# Patient Record
Sex: Male | Born: 1935 | Race: White | Hispanic: No | Smoking: Former smoker
Health system: Southern US, Community
[De-identification: ages and names within clinical notes are randomized; demographics above are authoritative.]

## PROBLEM LIST (undated history)

## (undated) DIAGNOSIS — R269 Unspecified abnormalities of gait and mobility: Secondary | ICD-10-CM

## (undated) DIAGNOSIS — I5030 Unspecified diastolic (congestive) heart failure: Secondary | ICD-10-CM

## (undated) DIAGNOSIS — I509 Heart failure, unspecified: Secondary | ICD-10-CM

## (undated) DIAGNOSIS — I2510 Atherosclerotic heart disease of native coronary artery without angina pectoris: Secondary | ICD-10-CM

## (undated) DIAGNOSIS — R0602 Shortness of breath: Secondary | ICD-10-CM

## (undated) DIAGNOSIS — C801 Malignant (primary) neoplasm, unspecified: Secondary | ICD-10-CM

## (undated) DIAGNOSIS — S4291XA Fracture of right shoulder girdle, part unspecified, initial encounter for closed fracture: Secondary | ICD-10-CM

## (undated) DIAGNOSIS — J189 Pneumonia, unspecified organism: Secondary | ICD-10-CM

## (undated) DIAGNOSIS — F0390 Unspecified dementia without behavioral disturbance: Secondary | ICD-10-CM

## (undated) DIAGNOSIS — M810 Age-related osteoporosis without current pathological fracture: Secondary | ICD-10-CM

## (undated) DIAGNOSIS — E119 Type 2 diabetes mellitus without complications: Secondary | ICD-10-CM

## (undated) DIAGNOSIS — I10 Essential (primary) hypertension: Secondary | ICD-10-CM

## (undated) HISTORY — PX: OTHER SURGICAL HISTORY: SHX169

## (undated) HISTORY — DX: Essential (primary) hypertension: I10

## (undated) HISTORY — PX: VASECTOMY: SHX75

## (undated) HISTORY — DX: Age-related osteoporosis without current pathological fracture: M81.0

## (undated) HISTORY — DX: Atherosclerotic heart disease of native coronary artery without angina pectoris: I25.10

## (undated) HISTORY — DX: Unspecified diastolic (congestive) heart failure: I50.30

## (undated) HISTORY — DX: Unspecified abnormalities of gait and mobility: R26.9

## (undated) HISTORY — DX: Fracture of right shoulder girdle, part unspecified, initial encounter for closed fracture: S42.91XA

---

## 2010-12-11 ENCOUNTER — Emergency Department (HOSPITAL_COMMUNITY): Payer: Medicare Other

## 2010-12-11 ENCOUNTER — Inpatient Hospital Stay (HOSPITAL_COMMUNITY)
Admission: EM | Admit: 2010-12-11 | Discharge: 2010-12-14 | DRG: 871 | Disposition: A | Discharge status: Home Health | Payer: Medicare Other | Attending: Internal Medicine | Admitting: Internal Medicine

## 2010-12-11 DIAGNOSIS — J189 Pneumonia, unspecified organism: Secondary | ICD-10-CM | POA: Diagnosis present

## 2010-12-11 DIAGNOSIS — I959 Hypotension, unspecified: Secondary | ICD-10-CM | POA: Diagnosis present

## 2010-12-11 DIAGNOSIS — A419 Sepsis, unspecified organism: Principal | ICD-10-CM | POA: Diagnosis present

## 2010-12-11 DIAGNOSIS — F329 Major depressive disorder, single episode, unspecified: Secondary | ICD-10-CM | POA: Diagnosis present

## 2010-12-11 DIAGNOSIS — F3289 Other specified depressive episodes: Secondary | ICD-10-CM | POA: Diagnosis present

## 2010-12-11 DIAGNOSIS — IMO0001 Reserved for inherently not codable concepts without codable children: Secondary | ICD-10-CM | POA: Diagnosis present

## 2010-12-11 DIAGNOSIS — F0390 Unspecified dementia without behavioral disturbance: Secondary | ICD-10-CM | POA: Diagnosis present

## 2010-12-11 DIAGNOSIS — E875 Hyperkalemia: Secondary | ICD-10-CM | POA: Diagnosis present

## 2010-12-11 DIAGNOSIS — F411 Generalized anxiety disorder: Secondary | ICD-10-CM | POA: Diagnosis present

## 2010-12-11 LAB — COMPREHENSIVE METABOLIC PANEL
Albumin: 3.3 g/dL — ABNORMAL LOW (ref 3.5–5.2)
BUN: 16 mg/dL (ref 6–23)
Calcium: 8.9 mg/dL (ref 8.4–10.5)
Creatinine, Ser: 1.39 mg/dL — ABNORMAL HIGH (ref 0.50–1.35)
Potassium: 4.5 mEq/L (ref 3.5–5.1)
Total Protein: 6.6 g/dL (ref 6.0–8.3)

## 2010-12-11 LAB — URINE MICROSCOPIC-ADD ON

## 2010-12-11 LAB — URINALYSIS, ROUTINE W REFLEX MICROSCOPIC
Bilirubin Urine: NEGATIVE
Nitrite: NEGATIVE
Specific Gravity, Urine: 1.024 (ref 1.005–1.030)
Urobilinogen, UA: 1 mg/dL (ref 0.0–1.0)

## 2010-12-11 LAB — GLUCOSE, CAPILLARY
Glucose-Capillary: 159 mg/dL — ABNORMAL HIGH (ref 70–99)
Glucose-Capillary: 158 mg/dL — ABNORMAL HIGH (ref 70–99)
Glucose-Capillary: 194 mg/dL — ABNORMAL HIGH (ref 70–99)
Glucose-Capillary: 238 mg/dL — ABNORMAL HIGH (ref 70–99)

## 2010-12-11 LAB — BLOOD GAS, VENOUS
Bicarbonate: 28.6 mEq/L — ABNORMAL HIGH (ref 20.0–24.0)
O2 Saturation: 74 %
Patient temperature: 98.6

## 2010-12-11 LAB — CBC
MCH: 28 pg (ref 26.0–34.0)
MCHC: 31.9 g/dL (ref 30.0–36.0)
MCV: 87.7 fL (ref 78.0–100.0)
Platelets: 183 10*3/uL (ref 150–400)
RDW: 13.4 % (ref 11.5–15.5)
WBC: 13.2 10*3/uL — ABNORMAL HIGH (ref 4.0–10.5)

## 2010-12-11 LAB — DIFFERENTIAL
Eosinophils Absolute: 0 10*3/uL (ref 0.0–0.7)
Eosinophils Relative: 0 % (ref 0–5)
Lymphs Abs: 0.3 10*3/uL — ABNORMAL LOW (ref 0.7–4.0)

## 2010-12-11 LAB — PROCALCITONIN: Procalcitonin: 0.81 ng/mL

## 2010-12-12 LAB — URINE CULTURE
Colony Count: NO GROWTH
Culture  Setup Time: 201210211120

## 2010-12-12 LAB — DIFFERENTIAL
Basophils Relative: 0 % (ref 0–1)
Eosinophils Absolute: 0.2 10*3/uL (ref 0.0–0.7)
Lymphocytes Relative: 16 % (ref 12–46)
Monocytes Absolute: 0.9 10*3/uL (ref 0.1–1.0)
Monocytes Relative: 12 % (ref 3–12)
Neutro Abs: 5.5 10*3/uL (ref 1.7–7.7)
Neutrophils Relative %: 70 % (ref 43–77)

## 2010-12-12 LAB — BASIC METABOLIC PANEL
Calcium: 8.7 mg/dL (ref 8.4–10.5)
Chloride: 103 mEq/L (ref 96–112)
Creatinine, Ser: 1.45 mg/dL — ABNORMAL HIGH (ref 0.50–1.35)
GFR calc Af Amer: 51 mL/min — ABNORMAL LOW (ref >90)

## 2010-12-12 LAB — GLUCOSE, CAPILLARY
Glucose-Capillary: 167 mg/dL — ABNORMAL HIGH (ref 70–99)
Glucose-Capillary: 285 mg/dL — ABNORMAL HIGH (ref 70–99)
Glucose-Capillary: 297 mg/dL — ABNORMAL HIGH (ref 70–99)

## 2010-12-12 LAB — CBC
MCH: 27.9 pg (ref 26.0–34.0)
MCV: 91.7 fL (ref 78.0–100.0)
Platelets: 159 10*3/uL (ref 150–400)
RDW: 13.6 % (ref 11.5–15.5)
WBC: 7.8 10*3/uL (ref 4.0–10.5)

## 2010-12-13 LAB — LIPID PANEL
Cholesterol: 185 mg/dL (ref 0–200)
Triglycerides: 100 mg/dL (ref <150)
VLDL: 20 mg/dL (ref 0–40)

## 2010-12-13 LAB — GLUCOSE, CAPILLARY
Glucose-Capillary: 326 mg/dL — ABNORMAL HIGH (ref 70–99)
Glucose-Capillary: 406 mg/dL — ABNORMAL HIGH (ref 70–99)

## 2010-12-13 LAB — CBC
MCH: 27.8 pg (ref 26.0–34.0)
Platelets: 191 10*3/uL (ref 150–400)

## 2010-12-13 LAB — BASIC METABOLIC PANEL
BUN: 17 mg/dL (ref 6–23)
CO2: 32 mEq/L (ref 19–32)
CO2: 28 mEq/L (ref 19–32)
Calcium: 9.8 mg/dL (ref 8.4–10.5)
Calcium: 9.7 mg/dL (ref 8.4–10.5)
Creatinine, Ser: 1.21 mg/dL (ref 0.50–1.35)
Glucose, Bld: 421 mg/dL — ABNORMAL HIGH (ref 70–99)
Sodium: 134 mEq/L — ABNORMAL LOW (ref 135–145)

## 2010-12-13 LAB — HEMOGLOBIN A1C
Hgb A1c MFr Bld: 7.8 % — ABNORMAL HIGH (ref <5.7)
Mean Plasma Glucose: 177 mg/dL — ABNORMAL HIGH (ref <117)

## 2010-12-14 LAB — BASIC METABOLIC PANEL
BUN: 18 mg/dL (ref 6–23)
CO2: 32 mEq/L (ref 19–32)
Chloride: 97 mEq/L (ref 96–112)
Creatinine, Ser: 1.27 mg/dL (ref 0.50–1.35)
Glucose, Bld: 65 mg/dL — ABNORMAL LOW (ref 70–99)

## 2010-12-14 LAB — GLUCOSE, CAPILLARY: Glucose-Capillary: 128 mg/dL — ABNORMAL HIGH (ref 70–99)

## 2010-12-14 NOTE — Discharge Summary (Addendum)
Brett Harris, Brett Harris               ACCOUNT NO.:  [id number]  MEDICAL RECORD NO.:  [id number]  LOCATION:  1407                         FACILITY:  [facility]  PHYSICIAN:  Kela Millin, M.D.DATE OF BIRTH:  [birth year]  DATE OF ADMISSION:  12/11/2010 DATE OF DISCHARGE:  12/14/2010                        DISCHARGE SUMMARY - REFERRING   [facility] C[facility] PROVID[facility]  [facility]  DISCHARGE DIAGNOSES: 1. Sepsis, likely secondary to atypical pneumonia. 2. Atypical pneumonia. 3. Hyperkalemia secondary to uncontrolled diabetes. 4. Diabetes type 2, uncontrolled. 5. Hypotension, resolved. 6. Dementia. 7. Depression/anxiety.  DISCHARGE MEDICATIONS: 1. Avelox 400 mg p.o. daily, take for 5 more days. 2. Prednisone 20 mg p.o. on October 25th and October 26th, then 10 mg     p.o. on October 27th and October 28th, then stop. 3. Aspirin enteric-coated 81 mg p.o. b.i.d. 4. Calcium carbonate/vitamin D 600 mg/400 units 1 tablet p.o. b.i.d. 5. Cholecalciferol/vitamin D3 of 1000 units 2 tablets p.o. daily. 6. Glipizide 10 mg p.o. b.i.d. 7. Novolin N insulin 15. 8. Omeprazole 20 mg p.o. daily. 9. Paroxetine 20 mg p.o. daily at bedtime. 10.Senokot 1 tablet p.o. every Monday, Wednesday, and Friday. 11.Seroquel 25 mg 2 tablets p.o. daily at bedtime. 12.Simvastatin 20 mg p.o. daily at bedtime. 13.Ambien 5 mg p.o. p.r.n. at bedtime for sleep.  DIAGNOSTIC LABS:  WBC is 13.2, hemoglobin 12.7, hematocrit 39.8, neutrophils 87%, absolute neutrophils 11.4.  Lactic acid 3.9.  Sodium 137, potassium 4.5, chloride 98, CO2 of 30, BUN 16, creatinine 1.39, glucose 336.  Procalcitonin 0.81.  MRSA PCR screening is negative. Urinalysis showed trace ketones, greater than 1000 glucose with 0 to 2 wbc's.  Venous blood gas yields a pH of 7.33, pCO2 of 54.9, pO2 of 42.2, bicarbonate 28.6, total CO2 of 26.0.  Blood culture is no growth to date.  Urine culture is no growth.  Hemoglobin A1c is 7.8.   Lipid profile yields LDL 116, otherwise unremarkable.  TSH is 1.22.  The proBNP is 944.8.  Random cortisol level is 16.1.  DIAGNOSTIC IMAGING:  Chest x-ray on October 21st yields cardiomegaly with interstitial prominence favoring interstitial edema over atypical pneumonia.  Atherosclerosis.  CONSULTATIONS:  None.  PROCEDURES:  None.  BRIEF HISTORY:  Brett Harris is an 77 year old male who presented to the [facility] Emergency Department on October 21st with a chief complaint of fever and chills intermittently for 2 to 3 days prior to presentation.  He also reported increased weakness and the daughter with whom he resides reports confusion and poor p.o. intake of food and liquids.  EMS was called by the family.  When they arrived, his blood pressure was found to be 67/44.  The patient was administered IV fluids en route to the emergency department; and when he arrived, his pressure was 105/49.  He was also tachycardic with a heart rate of 115.  In the emergency department, the patient had chest x-ray as dictated above.  He was also found to have an elevated white count with 87% neutrophils and lactic acid level.  [facility] were called to admit for further evaluation and treatment.  HOSPITAL COURSE BY PROBLEM: 1. Sepsis, likely secondary to atypical pneumonia.  In the emergency     department, antibiotic therapy was initiated to cover sepsis and     pneumonia with vancomycin and Zosyn.  He continued that for 5 days.     He quickly responded to fluid resuscitation and IV therapy.  He was     admitted to telemetry unit.  His heart rate became normal as well     as his respiratory rate.  At the time of this dictation, his blood     pressure has been stable.  His vital signs are stable.  His white     count was 10.3 on October 23rd down from 13.2 on admission.  He     will go home on p.o. Avelox for 5 more days. 2. Atypical pneumonia.  Chest x-ray was taken as indicated above.      Vancomycin and Zosyn IV during his hospitalization.  Will go home     on Avelox.  He will also have available oxygen support at home on     an as needed basis with exertion.  During his hospitalization, his     O2 sats have ranged between 93 to 95 on 2 L.  He does drop with     exertion on room air, so he will be going home with home O2. 3. Hyperkalemia, likely secondary to uncontrolled diabetes.  On     admission, the patient's potassium was 4.5.  On October 23rd, his     potassium was 5.8.  He was given insulin and his diabetic regimen     was changed.  At the time of this dictation, his potassium is 4.4. 4. Diabetes type 2, uncontrolled, complicated by prednisone started on     October 22nd for his wheezing and atypical pneumonia.  The patient     was covered with sliding scale insulin during his hospitalization.     Initially his glipizide was held, but as he improved and his     appetite returned, glipizide was resumed and he continued on     sliding scale insulin.  He will be tapered off his prednisone and     anticipate that his blood sugars will return to his normal     baseline.  Of note, his hemoglobin A1c was 7.8 on admission. 5. Hypertension.  On admission, the patient was mildly hypotensive.     He was fluid resuscitated.  His blood pressure range was 91/59 to     130/85.  At the time of this dictation, his blood pressure is     135/72. 6. Dementia.  At the time of this dictation, the patient is at his     baseline.  He lives with his daughter and will be going home with     her.  She indicates that his current mental status is indeed his     baseline. 7. Depression/anxiety.  Remained at baseline.  The patient was     continued on his home medications.  PHYSICAL EXAMINATION:  VITAL SIGNS:  Temperature 97.8, blood pressure 135/72, heart rate 66, respirations 16, sats 97% on 2 L. GENERAL:  Sitting up in chair, eating, smiling, no acute distress. CV:  Regular rate and  rhythm.  No murmur, gallop, or rub.  No lower extremity edema. RESPIRATORY:  Normal effort.  Breath sounds are clear to auscultation bilaterally.  No rhonchi, wheezes, or rales. ABDOMEN:  Obese, soft.  Positive bowel sounds throughout.  Nontender to palpitation.  No mass or organomegaly noted. NEUROLOGIC:  Alert and oriented to self only, cooperative, follows commands.  Can make his needs known.  ACTIVITY:  As tolerated.  DIET:  Carb modified, heart healthy.  FOLLOWUP:  The patient is to call his primary care provider, Dr. Oval Linsey, for followup appointment in 7 to 10 days.  DISPOSITION:  The patient is medically stable and ready for discharge.  TIME SPENT ON THIS DISCHARGE:  35 minutes.     Gwenyth Bender, NP   ______________________________ Kela Millin, M.D.    KMB/MEDQ  D:  12/14/2010  T:  12/14/2010  Job:  [id number]  Electronically Signed by Toya Smothers  on 12/14/2010 03:52:33 PM Electronically Signed by Donnalee Curry M.D. on 12/18/2010 03:33:35 PM

## 2010-12-17 LAB — CULTURE, BLOOD (ROUTINE X 2): Culture  Setup Time: 201210211119

## 2010-12-18 NOTE — H&P (Signed)
NAMEPATRIC, Brett Harris NO.:  [id number]  MEDICAL RECORD NO.:  [id number]  LOCATION:  1229                         FACILITY:  [facility]  PHYSICIAN:  Della Goo, M.D. DATE OF BIRTH:  Apr 02, 1927  DATE OF ADMISSION:  12/11/2010 DATE OF DISCHARGE:                             HISTORY & PHYSICAL   DATE OF ADMISSION:  12/11/2010  CHIEF COMPLAINT:  Fever.  HISTORY OF PRESENT ILLNESS:  This is an 77 year old male who was brought to the emergency department emergently secondary to fever and chills which he has had off and on for the past 2-3 days.  The patient has had increased weakness and confusion and has had poor intake of foods and liquids.  EMS was called to his home by his family and when they arrived, they checked his blood pressure and found it to be 67/44.  The patient was administered IV fluids on the way and when he arrived at the emergency department, his blood pressure was 105/49.  He was also tachycardic with a heart rate of 115.  The patient was evaluated in the emergency department and a chest x-ray revealed an atypical pneumonia.  The patient also was found to have an elevated white blood cell count of 13.2 with 87% neutrophils and lactic acid level was also performed and found to be elevated at 3.9.  The patient was referred for medical admission and placed on antibiotic therapy to cover sepsis and pneumonia with vancomycin and Zosyn.  He was referred to [facility] for [facility].  PAST MEDICAL HISTORY:  Significant for: 1. Dementia. 2. Type 2 diabetes mellitus. 3. Hyperlipidemia.  MEDICATIONS:  Simvastatin, aspirin, calcium, donepezil, Paxil, glipizide, and Novolin N.  ALLERGIES:  CYCLOSPORIN.  SOCIAL HISTORY:  The patient is a nonsmoker, nondrinker.  No history of illicit drug usage.  FAMILY HISTORY:  Unable to obtain.  REVIEW OF SYSTEMS:  Unable to obtain other than what is mentioned above in the HPI.  PHYSICAL  EXAMINATION:  GENERAL:  This is an 77 year old, morbidly obese, elderly Caucasian male who was in initial acute distress, now stabilizing. VITAL SIGNS:  His maximum temperature was 102.1 rectally, blood pressure initially 67/44 with a heart rate of 115, respirations 12; now blood pressure is 92/44, the heart rate is 90, respirations 25, and O2 saturations 93%. HEENT:  Normocephalic, atraumatic.  Pupils equally round, reactive to light.  Extraocular movements are intact.  Funduscopic benign.  There is no scleral icterus.  Nares are patent bilaterally.  Oropharynx is clear. NECK:  Supple.  Full range of motion.  No thyromegaly, adenopathy, jugular venous distention. CARDIOVASCULAR:  Tachycardic rate, rhythm.  No murmurs, gallops, rubs appreciated. LUNGS:  Decreased breath sounds.  No rales, rhonchi, wheezes appreciated. ABDOMEN:  Positive bowel sounds, soft, nontender, nondistended.  No hepatosplenomegaly. EXTREMITIES:  Without cyanosis, clubbing, or edema. NEUROLOGIC:  The patient is confused, but is not clear what his baseline is.  His speech is clear and he is able to move all 4 of his extremities.  LABORATORY STUDIES:  White blood cell count 13.2, hemoglobin 12.7, hematocrit 39.8, platelets 183, neutrophils 87%, lymphocytes 3%.  Sodium 137, potassium 4.5, chloride 98, carbon dioxide 30,  BUN 16, creatinine 1.39, and glucose 336.  Venous blood gas with a pH of 7.336, pCO2 of 54.9, pO2 of 42.2, bicarbonate 28.6, and O2 saturation 74%.  Lactic acid level 3.9.  Procalcitonin level 0.81.  Chest x-ray as mentioned above, reveals findings of atypical pneumonia.  ASSESSMENT:  An 77 year old male being admitted with, 1. Sepsis. 2. Atypical pneumonia. 3. Hypotension. 4. Hyperglycemia. 5. Dementia with acute delirium.  PLAN:  The patient will be admitted to the step-down ICU area.  He has been placed on antibiotic therapy.  Blood cultures have been performed. The antibiotic therapy is  vancomycin and Zosyn at this time.  Nebulizer treatments have also been ordered and supplemental oxygen has been ordered.  The patient has been placed on IV fluids for fluid resuscitation, and sliding scale insulin coverage has also been ordered for elevated blood sugars.  His regular medications will be further evaluated and reconciled.  The patient will be placed on DVT prophylaxis.  Further workup will ensue pending results patient's clinical course.  He is a full code at this time.     Della Goo, M.D.     HJ/MEDQ  D:  12/11/2010  T:  12/11/2010  Job:  [id number]  Electronically Signed by Della Goo M.D. on 12/18/2010 07:17:18 PM

## 2010-12-23 LAB — GLUCOSE, CAPILLARY: Glucose-Capillary: 392 mg/dL — ABNORMAL HIGH (ref 70–99)

## 2012-04-10 ENCOUNTER — Emergency Department (HOSPITAL_COMMUNITY): Payer: Medicare Other

## 2012-04-10 ENCOUNTER — Inpatient Hospital Stay (HOSPITAL_COMMUNITY)
Admission: EM | Admit: 2012-04-10 | Discharge: 2012-04-15 | DRG: 292 | Disposition: A | Discharge status: Skilled Nursing Facility | Payer: Medicare Other | Attending: Internal Medicine | Admitting: Internal Medicine

## 2012-04-10 ENCOUNTER — Encounter (HOSPITAL_COMMUNITY): Payer: Self-pay | Admitting: Emergency Medicine

## 2012-04-10 DIAGNOSIS — R609 Edema, unspecified: Secondary | ICD-10-CM

## 2012-04-10 DIAGNOSIS — E119 Type 2 diabetes mellitus without complications: Secondary | ICD-10-CM | POA: Diagnosis present

## 2012-04-10 DIAGNOSIS — I5031 Acute diastolic (congestive) heart failure: Principal | ICD-10-CM | POA: Diagnosis present

## 2012-04-10 DIAGNOSIS — I509 Heart failure, unspecified: Secondary | ICD-10-CM | POA: Diagnosis present

## 2012-04-10 DIAGNOSIS — F03918 Unspecified dementia, unspecified severity, with other behavioral disturbance: Secondary | ICD-10-CM | POA: Diagnosis present

## 2012-04-10 DIAGNOSIS — H9190 Unspecified hearing loss, unspecified ear: Secondary | ICD-10-CM | POA: Diagnosis present

## 2012-04-10 DIAGNOSIS — N179 Acute kidney failure, unspecified: Secondary | ICD-10-CM | POA: Diagnosis present

## 2012-04-10 DIAGNOSIS — Z66 Do not resuscitate: Secondary | ICD-10-CM | POA: Diagnosis present

## 2012-04-10 DIAGNOSIS — R0602 Shortness of breath: Secondary | ICD-10-CM

## 2012-04-10 HISTORY — DX: Unspecified dementia, unspecified severity, without behavioral disturbance, psychotic disturbance, mood disturbance, and anxiety: F03.90

## 2012-04-10 HISTORY — DX: Heart failure, unspecified: I50.9

## 2012-04-10 HISTORY — DX: Shortness of breath: R06.02

## 2012-04-10 HISTORY — DX: Malignant (primary) neoplasm, unspecified: C80.1

## 2012-04-10 HISTORY — DX: Pneumonia, unspecified organism: J18.9

## 2012-04-10 HISTORY — DX: Type 2 diabetes mellitus without complications: E11.9

## 2012-04-10 LAB — CBC WITH DIFFERENTIAL/PLATELET
Basophils Relative: 0 % (ref 0–1)
Eosinophils Absolute: 0.2 10*3/uL (ref 0.0–0.7)
MCH: 27.5 pg (ref 26.0–34.0)
MCHC: 30.7 g/dL (ref 30.0–36.0)
Neutrophils Relative %: 62 % (ref 43–77)
Platelets: 213 10*3/uL (ref 150–400)

## 2012-04-10 LAB — CBC
MCH: 27.2 pg (ref 26.0–34.0)
MCHC: 30.4 g/dL (ref 30.0–36.0)
MCV: 89.4 fL (ref 78.0–100.0)
Platelets: 217 10*3/uL (ref 150–400)
RBC: 4.82 MIL/uL (ref 4.22–5.81)
RDW: 14.7 % (ref 11.5–15.5)

## 2012-04-10 LAB — PRO B NATRIURETIC PEPTIDE: Pro B Natriuretic peptide (BNP): 1591 pg/mL — ABNORMAL HIGH (ref 0–450)

## 2012-04-10 LAB — COMPREHENSIVE METABOLIC PANEL
ALT: 13 U/L (ref 0–53)
Albumin: 3.4 g/dL — ABNORMAL LOW (ref 3.5–5.2)
Alkaline Phosphatase: 107 U/L (ref 39–117)
BUN: 17 mg/dL (ref 6–23)
Potassium: 4.9 mEq/L (ref 3.5–5.1)
Sodium: 138 mEq/L (ref 135–145)
Total Protein: 7.1 g/dL (ref 6.0–8.3)

## 2012-04-10 LAB — GLUCOSE, CAPILLARY: Glucose-Capillary: 265 mg/dL — ABNORMAL HIGH (ref 70–99)

## 2012-04-10 LAB — URINALYSIS, ROUTINE W REFLEX MICROSCOPIC
Glucose, UA: NEGATIVE mg/dL
Ketones, ur: NEGATIVE mg/dL
Leukocytes, UA: NEGATIVE
Specific Gravity, Urine: 1.009 (ref 1.005–1.030)
pH: 5 (ref 5.0–8.0)

## 2012-04-10 MED ORDER — INSULIN ASPART 100 UNIT/ML ~~LOC~~ SOLN
0.0000 [IU] | Freq: Three times a day (TID) | SUBCUTANEOUS | Status: DC
  Administered 2012-04-11: 8 [IU] via SUBCUTANEOUS
  Administered 2012-04-11 – 2012-04-13 (×5): 2 [IU] via SUBCUTANEOUS
  Administered 2012-04-13 – 2012-04-14 (×2): 3 [IU] via SUBCUTANEOUS
  Administered 2012-04-14 (×2): 2 [IU] via SUBCUTANEOUS
  Administered 2012-04-15: 8 [IU] via SUBCUTANEOUS

## 2012-04-10 MED ORDER — QUETIAPINE FUMARATE 50 MG PO TABS
50.0000 mg | ORAL_TABLET | Freq: Two times a day (BID) | ORAL | Status: DC
  Administered 2012-04-10 – 2012-04-14 (×7): 50 mg via ORAL
  Filled 2012-04-10 (×11): qty 1

## 2012-04-10 MED ORDER — SODIUM CHLORIDE 0.9 % IJ SOLN
3.0000 mL | Freq: Two times a day (BID) | INTRAMUSCULAR | Status: DC
  Administered 2012-04-10 – 2012-04-15 (×9): 3 mL via INTRAVENOUS

## 2012-04-10 MED ORDER — CALCIUM CARBONATE-VITAMIN D 500-200 MG-UNIT PO TABS
1.0000 | ORAL_TABLET | Freq: Two times a day (BID) | ORAL | Status: DC
  Administered 2012-04-10 – 2012-04-15 (×10): 1 via ORAL
  Filled 2012-04-10 (×12): qty 1

## 2012-04-10 MED ORDER — INSULIN ASPART 100 UNIT/ML ~~LOC~~ SOLN
0.0000 [IU] | Freq: Every day | SUBCUTANEOUS | Status: DC
  Administered 2012-04-10 – 2012-04-13 (×2): 3 [IU] via SUBCUTANEOUS

## 2012-04-10 MED ORDER — SERTRALINE HCL 50 MG PO TABS
50.0000 mg | ORAL_TABLET | Freq: Every morning | ORAL | Status: DC
  Administered 2012-04-11 – 2012-04-15 (×5): 50 mg via ORAL
  Filled 2012-04-10 (×5): qty 1

## 2012-04-10 MED ORDER — ATORVASTATIN CALCIUM 40 MG PO TABS
40.0000 mg | ORAL_TABLET | Freq: Every day | ORAL | Status: DC
  Administered 2012-04-10 – 2012-04-14 (×5): 40 mg via ORAL
  Filled 2012-04-10 (×7): qty 1

## 2012-04-10 MED ORDER — ASPIRIN EC 81 MG PO TBEC
81.0000 mg | DELAYED_RELEASE_TABLET | Freq: Two times a day (BID) | ORAL | Status: DC
  Administered 2012-04-10 – 2012-04-15 (×10): 81 mg via ORAL
  Filled 2012-04-10 (×11): qty 1

## 2012-04-10 MED ORDER — INSULIN GLARGINE 100 UNIT/ML ~~LOC~~ SOLN
30.0000 [IU] | Freq: Every day | SUBCUTANEOUS | Status: DC
  Administered 2012-04-10 – 2012-04-14 (×5): 30 [IU] via SUBCUTANEOUS

## 2012-04-10 MED ORDER — FUROSEMIDE 10 MG/ML IJ SOLN
60.0000 mg | Freq: Once | INTRAMUSCULAR | Status: AC
  Administered 2012-04-10: 60 mg via INTRAVENOUS
  Filled 2012-04-10: qty 8

## 2012-04-10 MED ORDER — FUROSEMIDE 10 MG/ML IJ SOLN
20.0000 mg | Freq: Every day | INTRAMUSCULAR | Status: DC
  Administered 2012-04-11 – 2012-04-12 (×2): 20 mg via INTRAVENOUS
  Filled 2012-04-10 (×2): qty 2

## 2012-04-10 MED ORDER — SODIUM CHLORIDE 0.9 % IJ SOLN: 3.0000 mL | INTRAMUSCULAR | Status: DC | PRN

## 2012-04-10 MED ORDER — ACETAMINOPHEN 325 MG PO TABS
650.0000 mg | ORAL_TABLET | ORAL | Status: DC | PRN
  Administered 2012-04-14: 650 mg via ORAL
  Filled 2012-04-10: qty 2

## 2012-04-10 MED ORDER — HEPARIN SODIUM (PORCINE) 5000 UNIT/ML IJ SOLN
5000.0000 [IU] | Freq: Three times a day (TID) | INTRAMUSCULAR | Status: DC
  Administered 2012-04-10 – 2012-04-15 (×15): 5000 [IU] via SUBCUTANEOUS
  Filled 2012-04-10 (×17): qty 1

## 2012-04-10 MED ORDER — QUETIAPINE 12.5 MG HALF TABLET
12.5000 mg | ORAL_TABLET | Freq: Two times a day (BID) | ORAL | Status: DC | PRN
  Administered 2012-04-11 – 2012-04-12 (×3): 12.5 mg via ORAL
  Filled 2012-04-10 (×4): qty 1

## 2012-04-10 MED ORDER — ONDANSETRON HCL 4 MG/2ML IJ SOLN: 4.0000 mg | Freq: Four times a day (QID) | INTRAMUSCULAR | Status: DC | PRN

## 2012-04-10 MED ORDER — SODIUM CHLORIDE 0.9 % IV SOLN: 250.0000 mL | INTRAVENOUS | Status: DC | PRN

## 2012-04-10 MED ORDER — SODIUM CHLORIDE 0.9 % IV SOLN
INTRAVENOUS | Status: DC
  Administered 2012-04-10 (×2): via INTRAVENOUS

## 2012-04-10 NOTE — ED Provider Notes (Signed)
History     CSN: [id number]  Arrival date & time 04/10/12  1345   First MD Initiated Contact with Patient 04/10/12 1511      Chief Complaint  Patient presents with  . Weakness    (Consider location/radiation/quality/duration/timing/severity/associated sxs/prior treatment) Patient is a 79 y.o. male presenting with weakness. The history is provided by the patient and a relative.  Weakness   patient here with increased weakness x1 week that became worse today. He has had associated cough has been productive but without fever. No recent falls, headache, vomiting, diarrhea. Does have a history of Alzheimer's and his daughter noticed that he is had some anorexia. No rashes appreciated. Went to urgent care prior to arrival and had a UA that was normal but had an abnormal EKG and was sent her for further evaluation. He denies any anginal type chest pain  Past Medical History  Diagnosis Date  . Dementia     No past surgical history on file.  No family history on file.  History  Substance Use Topics  . Smoking status: Not on file  . Smokeless tobacco: Not on file  . Alcohol Use: Not on file      Review of Systems  Neurological: Positive for weakness.  All other systems reviewed and are negative.    Allergies  Review of patient's allergies indicates not on file.  Home Medications  No current outpatient prescriptions on file.  BP 87/68  Pulse 77  Temp(Src) 97.8 F (36.6 C) (Oral)  Resp 18  SpO2 99%  Physical Exam  Nursing note and vitals reviewed. Constitutional: He is oriented to person, place, and time. He appears well-developed and well-nourished.  Non-toxic appearance. No distress.  HENT:  Head: Normocephalic and atraumatic.  Eyes: Conjunctivae, EOM and lids are normal. Pupils are equal, round, and reactive to light.  Neck: Normal range of motion. Neck supple. No tracheal deviation present. No mass present.  Cardiovascular: Normal rate, regular rhythm and  normal heart sounds.  Exam reveals no gallop.   No murmur heard. Pulmonary/Chest: Effort normal. No stridor. No respiratory distress. He has decreased breath sounds. He has no wheezes. He has rhonchi. He has no rales.  Abdominal: Soft. Normal appearance and bowel sounds are normal. He exhibits no distension. There is no tenderness. There is no rebound and no CVA tenderness.  Musculoskeletal: Normal range of motion. He exhibits no edema and no tenderness.  Neurological: He is alert and oriented to person, place, and time. He has normal strength. No cranial nerve deficit or sensory deficit. GCS eye subscore is 4. GCS verbal subscore is 5. GCS motor subscore is 6.  Skin: Skin is warm and dry. No abrasion and no rash noted.  Psychiatric: He has a normal mood and affect. His speech is normal and behavior is normal.    ED Course  Procedures (including critical care time)  Labs Reviewed  GLUCOSE, CAPILLARY - Abnormal; Notable for the following:    Glucose-Capillary 116 (*)    All other components within normal limits  URINE CULTURE  CBC WITH DIFFERENTIAL  COMPREHENSIVE METABOLIC PANEL  URINALYSIS, ROUTINE W REFLEX MICROSCOPIC  PRO B NATRIURETIC PEPTIDE   No results found.   No diagnosis found.    MDM   Date: 04/10/2012  Rate: 84  Rhythm: normal sinus rhythm  QRS Axis: normal  Intervals: normal  ST/T Wave abnormalities: normal  Conduction Disutrbances:none  Narrative Interpretation:   Old EKG Reviewed: none available  5:32 PM Patient given Lasix  for his early CHF will be admitted by the hospital        Toy Baker, MD 04/10/12 [id number]

## 2012-04-10 NOTE — ED Notes (Signed)
Patient transported to X-ray 

## 2012-04-10 NOTE — Progress Notes (Signed)
[facility] ED CM noted Cm consult for home health services & [facility] services CM spoke with pt, wife about choice of services and pt is active with [facility] and the choice is to remain active with [facility]. Pt & wife expressed that they preferred to be at [facility] versus [facility] facility at this time.  Wife has contacted [facility] to inform them that pt is in hospital.  She inquired if she should contact the [place]. ED CM informed her she can call the [facility] if she prefers.    HOME HEALTH AGENCIES SERVING [place]   Agencies that are Medicare-Certified and are affiliated with The [facility] Health Agency  Telephone Number Address  [facility]   The [facility] has ownership interest in this company; however, you are under no obligation to use this agency. [phone] or  [phone] [address] [place], [place] [address] [email or web address]   Agencies that are Medicare-Certified and are not affiliated with The [facility] Agency Telephone Number Address  [facility] [phone] Fax [phone] [address], [address] [place], [place]  [address] [email or web address]  [facility] [phone] or [phone] Fax [phone] [address] [place], [place] [address] [email or web address]  [facility] [phone] Fax [phone] [address] [place], [place] [address] [email or web address]  [facility] [phone] Fax [phone] [address] [place], [place]  [address] [email or web address]  [facility] [phone] Fax [phone] [address] [place], [place] [address] [email or web address]  [facility] [phone] [address] [place], [place]  [address] NationalDirectors.dk  [facility] [phone]  [address] [place], [place] [address] [email or web address]  [facility] [phone] or [phone] Fax number [phone] [address] [place], [place]  [address] [email or web address]  [facility] [phone] Fax [phone] [address] [place], [place]  [address]  [facility]  [phone] Fax [phone] [address] [place], [place] [address] [email or web address]

## 2012-04-10 NOTE — ED Notes (Signed)
Daughter sent pt to urgent care and ems was called due to daughter said pt is not eating for 1 week and weak. Pt denies this and is asking for food at this time. Pt has dementia and denies any of these compliants. Alert to person and location. bm this am, urinate without diffuculties.

## 2012-04-10 NOTE — Progress Notes (Signed)
Correction: male at pt's bedside is the daughter states pcp is at [facility] [address] [facility] [place]?

## 2012-04-10 NOTE — ED Notes (Addendum)
Pt's daughter sts pt has not been feeling well or eating well for "atleast five days."  Denies pain, except " from sitting in this bed."  Daughter sts peripheral edema x 1 week.  Pt c/o difficulty taking a deep breaths.

## 2012-04-10 NOTE — ED Notes (Signed)
ZOX:[id number]<AV> Expected date:04/10/12<BR> Expected time: 1:29 PM<BR> Means of arrival:Ambulance<BR> Comments:<BR> aloc

## 2012-04-10 NOTE — H&P (Signed)
[facility] History and Physical  Kazden Moeser QMV:[id number] DOB: [birth year] DOA: 04/10/2012  Referring physician: er PCP: No primary provider on file.  Specialists: none  Chief Complaint: SOB  HPI: Brett Harris is a 79 y.o. male  Who is demented and can not provide much history.  He is also very hard of hearing.  Daughter relays that has had increasing SOB and weakness over the last few weeks.  No fever.  + cough.  Patient does not eat or drink much per daughter.  No chest pain.  Daughter relays that patient has had LE edema and the inability to lay flat while sleeping.  He ambulates well with a cane at home but has a tendency to wander.  In the ER, he was found to have fluid on chest x ray and an elevated BNP.  He was given 60 IV lasis and hospitalist were called for admission.   Review of Systems: unable to do full ROS due to dementia  Past Medical History  Diagnosis Date  . Dementia    No past surgical history on file. Social History:  has no tobacco, alcohol, and drug history on file. Lives at home with daughter who cares for him 24/7- walks with a cane  Allergies  Allergen Reactions  . Cephalosporins     Family Hx; CAD  Prior to Admission medications   Medication Sig Start Date End Date Taking? Authorizing Provider  aspirin EC 81 MG tablet Take 81 mg by mouth 2 (two) times daily.   Yes Historical Provider, MD  atorvastatin (LIPITOR) 40 MG tablet Take 40 mg by mouth every evening.   Yes Historical Provider, MD  calcium-vitamin D (OSCAL WITH D) 500-200 MG-UNIT per tablet Take 1 tablet by mouth 2 (two) times daily.   Yes Historical Provider, MD  insulin glargine (LANTUS) 100 UNIT/ML injection Inject 30 Units into the skin at bedtime.   Yes Historical Provider, MD  insulin regular (NOVOLIN R,HUMULIN R) 100 units/mL injection Inject 6-18 Units into the skin 3 (three) times daily before meals. Takes 18 units at breakfast 16 units at lunch and 6 units at dinner   Yes  Historical Provider, MD  QUEtiapine (SEROQUEL) 25 MG tablet Take 12.5 mg by mouth 2 (two) times daily as needed (mood).   Yes Historical Provider, MD  QUEtiapine (SEROQUEL) 50 MG tablet Take 50 mg by mouth 2 (two) times daily.   Yes Historical Provider, MD  senna (SENOKOT) 8.6 MG tablet Take 2 tablets by mouth every Monday, Wednesday, and Friday.   Yes Historical Provider, MD  sertraline (ZOLOFT) 50 MG tablet Take 50 mg by mouth every morning.   Yes Historical Provider, MD   Physical Exam: Filed Vitals:   04/10/12 1434 04/10/12 1559  BP: 87/68 119/62  Pulse: 77 86  Temp: 97.8 F (36.6 C) 97.9 F (36.6 C)  TempSrc: Oral   Resp: 18 20  SpO2: 99% 97%     General:  Pleasant/cooperative, hard of hearing  Eyes: wnl  ENT: wnl  Neck: no JVD but unable to lay flat due to back pain  Cardiovascular: rrr  Respiratory: few crackles on back lung- no wheezing  Abdomen: +BS, soft, NT.ND  Skin: few areas of bruising  Musculoskeletal: moves all 4 extremities  Psychiatric: no SI/no HI  Neurologic: CN 2-12 intact  Labs on Admission:  Basic Metabolic Panel:  Recent Labs Lab 04/10/12 1611  NA 138  K 4.9  CL 96  CO2 33*  GLUCOSE 79  BUN 17  CREATININE 1.34  CALCIUM 9.0   Liver Function Tests:  Recent Labs Lab 04/10/12 1611  AST 46*  ALT 13  ALKPHOS 107  BILITOT 0.4  PROT 7.1  ALBUMIN 3.4*   No results found for this basename: LIPASE, AMYLASE,  in the last 168 hours No results found for this basename: AMMONIA,  in the last 168 hours CBC:  Recent Labs Lab 04/10/12 1611  WBC 8.7  NEUTROABS 5.4  HGB 12.8*  HCT 41.7  MCV 89.7  PLT 213   Cardiac Enzymes: No results found for this basename: CKTOTAL, CKMB, CKMBINDEX, TROPONINI,  in the last 168 hours  BNP (last 3 results)  Recent Labs  04/10/12 1611  PROBNP 1591.0*   CBG:  Recent Labs Lab 04/10/12 1401  GLUCAP 116*    Radiological Exams on Admission: Dg Chest 2 View  04/10/2012  *RADIOLOGY  REPORT*  Clinical Data: Short of breath, chest pain for 2 days, dementia  CHEST - 2 VIEW  Comparison: Portable chest x-ray of 12/11/2010  Findings: Moderate cardiomegaly is stable, and there is mild pulmonary vascular congestion present.  Small effusions cannot be excluded.  No skeletal abnormality is seen.  Fixation device is noted within the right humeral head and neck.  IMPRESSION: Cardiomegaly with mild pulmonary vascular congestion.   Original Report Authenticated By: Dwyane Dee, M.D.       Assessment/Plan Active Problems:   SOB (shortness of breath)   Edema   Diabetes   1. SOB- check Echo, chest x ray consistant with pulm edema.  Cycle CE, lasix, daily weights, strict I/Os, only requiring 1L O2, increased BNP so I suspect some form of heart failure 2. CKD- stable 3. Dementia- at baseline 4. LE edema- stable, monitor response to diuretic 5. DM- SSI, check HgbA1C     Code Status: DNR- discussed with daughter, MPOA Family Communication: daughter at bedside Disposition Plan: 2-3 days- daughter plans to take back home after D/C  Time spent: 73 min  Benjamine Mola [facility] Pager [phone]  If 7PM-7AM, please contact night-coverage [email or web address] Password [facility] 04/10/2012, 5:44 PM

## 2012-04-10 NOTE — Progress Notes (Signed)
Pt called Cm to assist him with his call bell instructions and pt with concern about not having fluids in CM explained to pt how to use his call bell and why it is important not to have po intake until EDP states it is ok

## 2012-04-11 DIAGNOSIS — I517 Cardiomegaly: Secondary | ICD-10-CM

## 2012-04-11 LAB — TROPONIN I
Troponin I: <0.3 ng/mL (ref <0.30)
Troponin I: <0.3 ng/mL (ref <0.30)

## 2012-04-11 LAB — BASIC METABOLIC PANEL
CO2: 34 mEq/L — ABNORMAL HIGH (ref 19–32)
GFR calc non Af Amer: 43 mL/min — ABNORMAL LOW (ref >90)
Glucose, Bld: 132 mg/dL — ABNORMAL HIGH (ref 70–99)
Potassium: 4.3 mEq/L (ref 3.5–5.1)
Sodium: 139 mEq/L (ref 135–145)

## 2012-04-11 LAB — URINE CULTURE: Colony Count: NO GROWTH

## 2012-04-11 LAB — GLUCOSE, CAPILLARY
Glucose-Capillary: 127 mg/dL — ABNORMAL HIGH (ref 70–99)
Glucose-Capillary: 284 mg/dL — ABNORMAL HIGH (ref 70–99)
Glucose-Capillary: 143 mg/dL — ABNORMAL HIGH (ref 70–99)
Glucose-Capillary: 173 mg/dL — ABNORMAL HIGH (ref 70–99)

## 2012-04-11 LAB — HEMOGLOBIN A1C
Hgb A1c MFr Bld: 7.6 % — ABNORMAL HIGH (ref <5.7)
Mean Plasma Glucose: 171 mg/dL — ABNORMAL HIGH (ref <117)

## 2012-04-11 LAB — MAGNESIUM: Magnesium: 1.9 mg/dL (ref 1.5–2.5)

## 2012-04-11 LAB — TSH: TSH: 3.056 u[IU]/mL (ref 0.350–4.500)

## 2012-04-11 LAB — CREATININE, SERUM: Creatinine, Ser: 1.55 mg/dL — ABNORMAL HIGH (ref 0.50–1.35)

## 2012-04-11 MED ORDER — LORAZEPAM 2 MG/ML IJ SOLN
0.5000 mg | Freq: Once | INTRAMUSCULAR | Status: AC
  Administered 2012-04-11: 0.5 mg via INTRAMUSCULAR
  Filled 2012-04-11: qty 1

## 2012-04-11 NOTE — Progress Notes (Signed)
Pt had 5 beats  V-tach. Pt asymptomatic and asleep. Pt having frequent PVC's. VS stable. MD notified. Mag added to labs. Will cont to monitor.  Earnest Conroy. Clelia Croft, RN

## 2012-04-11 NOTE — Evaluation (Signed)
Physical Therapy Evaluation Patient Details Name: Brett Harris MRN: [id number] DOB: 12-Nov-1927 Today's Date: 04/11/2012 Time: 4098-1191 PT Time Calculation (min): 22 min  PT Assessment / Plan / Recommendation Clinical Impression  Pt presents with SOB.  Noted history of dementia and is very HOH.  Tolerated OOB and took several steps from bed to chair with min/mod assist, however refused to ambulate and stated he "just wanted to rest."  Pt will benefit from skilled PT in acute venue to address deficits.  PT recommends SNF for follow up at D/C, however noted in chart that pts daughter wants to take pt home, therefore recommend 24/7 hands on care/supervision with HHPT.      PT Assessment  Patient needs continued PT services    Follow Up Recommendations  Home health PT;SNF;Supervision/Assistance - 24 hour    Does the patient have the potential to tolerate intense rehabilitation      Barriers to Discharge   Unsure if daughter was providing 24/7 assist.     Equipment Recommendations  Rolling walker with 5" wheels    Recommendations for Other Services OT consult   Frequency Min 3X/week    Precautions / Restrictions Precautions Precautions: Fall Precaution Comments: monitor O2 Restrictions Weight Bearing Restrictions: No   Pertinent Vitals/Pain Reports some pain in B feet.       Mobility  Bed Mobility Bed Mobility: Supine to Sit Supine to Sit: 3: Mod assist;HOB elevated Details for Bed Mobility Assistance: Requires hand held assist from therapist in order to get trunk into sitting position.  Provided cues for technique, however pt unable to fully follow commands during session.  Transfers Transfers: Sit to Stand;Stand to [employer] Transfers Sit to Stand: 4: Min assist;With upper extremity assist;From bed Stand to Sit: 4: Min assist;With upper extremity assist;With armrests;To chair/3-in-1 Stand Pivot Transfers: 4: Min assist;3: Mod assist Details for Transfer Assistance:  Assist to rise, steady and ensure controlled descent with max cues for hand placement, however pt refused to push from bed and would only pull up on RW. Pt able to take several steps from bed to chair with min/mod assist, however refused to ambulate any further and stated he "wanted to rest."  Ambulation/Gait Ambulation/Gait Assistance Details: Again, pt refused to ambulate and stated he "just wanted to rest."   Stairs: No Wheelchair Mobility Wheelchair Mobility: No    Exercises     PT Diagnosis: Difficulty walking;Generalized weakness;Acute pain  PT Problem List: Decreased strength;Decreased activity tolerance;Decreased balance;Decreased mobility;Decreased coordination;Decreased cognition;Decreased knowledge of use of DME;Decreased safety awareness;Decreased knowledge of precautions;Pain PT Treatment Interventions: DME instruction;Gait training;Functional mobility training;Therapeutic activities;Therapeutic exercise;Stair training;Balance training;Patient/family education   PT Goals Acute Rehab PT Goals PT Goal Formulation: Patient unable to participate in goal setting Time For Goal Achievement: 04/25/12 Potential to Achieve Goals: Fair Pt will go Supine/Side to Sit: with supervision PT Goal: Supine/Side to Sit - Progress: Goal set today Pt will go Sit to Supine/Side: with supervision PT Goal: Sit to Supine/Side - Progress: Goal set today Pt will go Sit to Stand: with supervision PT Goal: Sit to Stand - Progress: Goal set today Pt will go Stand to Sit: with supervision PT Goal: Stand to Sit - Progress: Goal set today Pt will Ambulate: 51 - 150 feet;with supervision;with least restrictive assistive device PT Goal: Ambulate - Progress: Goal set today Pt will Go Up / Down Stairs:  (to set if pt D/Cs home w/ stairs. )  Visit Information  Last PT Received On: 04/11/12 Assistance Needed: +1  Subjective Data  Subjective: I just want to rest for a while.  Patient Stated Goal: n/a    Prior Functioning  Home Living Lives With: Daughter Available Help at Discharge: Family Type of Home: House Additional Comments: Per notes, pt lives with daughter.  Pt poor historian and unable to fully obtain PLOF or home environment info.   Prior Function Comments: Pt states he was using a walker and cane sometimes.  Again pt poor historian and unable to fully obtain full PLOF.     Cognition  Cognition Overall Cognitive Status: No family/caregiver present to determine baseline cognitive functioning Arousal/Alertness: Lethargic Orientation Level: Disoriented to;Place;Situation;Time Behavior During Session: [place] for tasks performed    Extremity/Trunk Assessment Right Lower Extremity Assessment RLE ROM/Strength/Tone: Deficits RLE ROM/Strength/Tone Deficits: Pt with generalized weakness, grossly 3/5 per tasks assessed.  Left Lower Extremity Assessment LLE ROM/Strength/Tone: Deficits LLE ROM/Strength/Tone Deficits: Pt with generalized weakness, grossly 3/5 per tasks assessed.  Trunk Assessment Trunk Assessment: Kyphotic   Balance    End of Session PT - End of Session Equipment Utilized During Treatment: Gait belt Activity Tolerance: Patient limited by fatigue Patient left: in chair;with call bell/phone within reach;with chair alarm set;with nursing in room Nurse Communication: Mobility status  GP     Vista Deck 04/11/2012, 11:50 AM

## 2012-04-11 NOTE — Progress Notes (Signed)
  Echocardiogram 2D Echocardiogram has been performed.  Cathie Beams 04/11/2012, 3:18 PM

## 2012-04-11 NOTE — Progress Notes (Addendum)
[facility] PROGRESS NOTE  Brett Harris NFA:[id number] DOB: 15-Feb-1928 DOA: 04/10/2012 PCP: [facility], MD  Assessment/Plan: 1. Probable acute CHF  -will continue lasix -cardiac enzymes neg -await echo to further eval 2. DM -continue lantus and SSI 3. Dementia -continue outpt meds, per daughter recently started on seroquel- and dose adjusted to 50 to control his behavioral problems, will continue. Also on zoloft. 4.ARF -Monitor closely with with diuresis  Code Status: DNR Family Communication: Daughter at bedside Disposition Plan: likely home with 24/7 assistance when medically stable   Consultants:  none  Procedures:  none  Antibiotics:  none  HPI/Subjective: Pt sleepy but easily aroused, states breathing better and per daughter less swelling.He denies CP.  Objective: Filed Vitals:   04/10/12 2306 04/11/12 0437 04/11/12 0500 04/11/12 1400  BP: 126/76 146/92  124/57  Pulse: 80 91  80  Temp:  97.5 F (36.4 C)  97.8 F (36.6 C)  TempSrc:  Oral  Oral  Resp:    20  Height:      Weight:   106.6 kg (235 lb 0.2 oz)   SpO2:  94%  96%    Intake/Output Summary (Last 24 hours) at 04/11/12 1716 Last data filed at 04/11/12 1254  Gross per 24 hour  Intake 595.67 ml  Output   2050 ml  Net -1454.33 ml   Filed Weights   04/10/12 2144 04/11/12 0500  Weight: 108 kg (238 lb 1.6 oz) 106.6 kg (235 lb 0.2 oz)    Exam:   General: Elderly male, sleepy but easily aroused in NAD  Cardiovascular: RRR, nl s1s2  Respiratory: scattered rhonchi bil, no wheezes  Abdomen: soft,+BS NT/ND  EXT: +1edema, no cyanosis  Data Reviewed: Basic Metabolic Panel:  Recent Labs Lab 04/10/12 1611 04/10/12 2316 04/11/12 0255  NA 138  --  139  K 4.9  --  4.3  CL 96  --  97  CO2 33*  --  34*  GLUCOSE 79  --  132*  BUN 17  --  20  CREATININE 1.34 1.55* 1.44*  CALCIUM 9.0  --  9.0  MG  --   --  1.9   Liver Function Tests:  Recent Labs Lab 04/10/12 1611   AST 46*  ALT 13  ALKPHOS 107  BILITOT 0.4  PROT 7.1  ALBUMIN 3.4*   No results found for this basename: LIPASE, AMYLASE,  in the last 168 hours No results found for this basename: AMMONIA,  in the last 168 hours CBC:  Recent Labs Lab 04/10/12 1611 04/10/12 2316  WBC 8.7 8.5  NEUTROABS 5.4  --   HGB 12.8* 13.1  HCT 41.7 43.1  MCV 89.7 89.4  PLT 213 217   Cardiac Enzymes:  Recent Labs Lab 04/10/12 2316 04/11/12 0235 04/11/12 0845  TROPONINI <0.30 <0.30 <0.30   BNP (last 3 results)  Recent Labs  04/10/12 1611  PROBNP 1591.0*   CBG:  Recent Labs Lab 04/10/12 1401 04/10/12 2147 04/11/12 0803 04/11/12 1149  GLUCAP 116* 265* 127* 284*    No results found for this or any previous visit (from the past 240 hour(s)).   Studies: Dg Chest 2 View  04/10/2012  *RADIOLOGY REPORT*  Clinical Data: Short of breath, chest pain for 2 days, dementia  CHEST - 2 VIEW  Comparison: Portable chest x-ray of 12/11/2010  Findings: Moderate cardiomegaly is stable, and there is mild pulmonary vascular congestion present.  Small effusions cannot be excluded.  No skeletal abnormality is seen.  Fixation device is noted within the right humeral head and neck.  IMPRESSION: Cardiomegaly with mild pulmonary vascular congestion.   Original Report Authenticated By: Dwyane Dee, M.D.     Scheduled Meds: . aspirin EC  81 mg Oral BID  . atorvastatin  40 mg Oral q1800  . calcium-vitamin D  1 tablet Oral BID WC  . furosemide  20 mg Intravenous Daily  . heparin  5,000 Units Subcutaneous Q8H  . insulin aspart  0-15 Units Subcutaneous TID WC  . insulin aspart  0-5 Units Subcutaneous QHS  . insulin glargine  30 Units Subcutaneous QHS  . QUEtiapine  50 mg Oral BID  . sertraline  50 mg Oral q morning - 10a  . sodium chloride  3 mL Intravenous Q12H   Continuous Infusions: . sodium chloride 10 mL/hr at 04/10/12 2258    Active Problems:   SOB (shortness of breath)   Edema   Diabetes    Time  spent: >35MINS    [facility]  [facility] Pager [phone]. If 8PM-8AM, please contact night-coverage at [email or web address], password [facility] 04/11/2012, 5:16 PM  LOS: 1 day

## 2012-04-11 NOTE — Progress Notes (Signed)
Pt had 17 beats of V-tach. Pt asymptomatic, VS stable. On call MD notified. No new orders received. Will continue to monitor.  Earnest Conroy. Clelia Croft, RN

## 2012-04-11 NOTE — Progress Notes (Signed)
Inpatient Diabetes Program Recommendations  AACE/ADA: New Consensus Statement on Inpatient Glycemic Control (2013)  Target Ranges:  Prepandial:   less than 140 mg/dL      Peak postprandial:   less than 180 mg/dL (1-2 hours)      Critically ill patients:  140 - 180 mg/dL   [place] for Visit: Hyperglycemia  Results for JAMILE, SIVILS (MRN [id number]) as of 04/11/2012 13:10  Ref. Range 04/10/2012 14:01 04/10/2012 21:47 04/11/2012 08:03 04/11/2012 11:49  Glucose-Capillary Latest Range: 70-99 mg/dL 409 (H) 811 (H) 914 (H) 284 (H)   Results for Clauss, Coran (MRN [id number]) as of 04/11/2012 13:10  Ref. Range 04/11/2012 02:55  Hemoglobin A1C Latest Range: <5.7 % 7.6 (H)    Inpatient Diabetes Program Recommendations Insulin - Meal Coverage: Add meal coverage insulin if pt eats >50% meal - Novolog 3 units tidwc  Note: Will continue to follow.  Thank you. Ailene Ards, RD, LDN, CDE Inpatient Diabetes Coordinator [phone]

## 2012-04-12 LAB — BASIC METABOLIC PANEL
BUN: 27 mg/dL — ABNORMAL HIGH (ref 6–23)
CO2: 35 mEq/L — ABNORMAL HIGH (ref 19–32)
Calcium: 9.4 mg/dL (ref 8.4–10.5)
Creatinine, Ser: 1.7 mg/dL — ABNORMAL HIGH (ref 0.50–1.35)

## 2012-04-12 LAB — GLUCOSE, CAPILLARY
Glucose-Capillary: 93 mg/dL (ref 70–99)
Glucose-Capillary: 90 mg/dL (ref 70–99)
Glucose-Capillary: 159 mg/dL — ABNORMAL HIGH (ref 70–99)

## 2012-04-12 MED ORDER — QUETIAPINE 12.5 MG HALF TABLET
12.5000 mg | ORAL_TABLET | ORAL | Status: DC
  Administered 2012-04-13 – 2012-04-15 (×3): 12.5 mg via ORAL
  Filled 2012-04-12 (×3): qty 1

## 2012-04-12 NOTE — Progress Notes (Signed)
Pt woke up screaming and inconsolable. Daughter at bedside. MD on-call Notified. Orders to give prn seroquel now. Will continue to monitor.  Earnest Conroy. Clelia Croft, RN

## 2012-04-12 NOTE — Progress Notes (Signed)
   CARE MANAGEMENT NOTE 04/12/2012  Patient:  Brett Harris, Brett Harris   Account Number:  [id number]  Date Initiated:  04/12/2012  Documentation initiated by:  Jiles Crocker  Subjective/Objective Assessment:   ADMITTED WITH CHF     Action/Plan:   [facility];  LIVES WITH FAMILY MEMBERS AND IS ACTIVE WITH [place] FOR HOME HEALTH CARE; POSSIBLY NEED SNF AT DISCHARGE   Anticipated DC Date:  04/17/2012   Anticipated DC Plan:  SKILLED NURSING FACILITY  In-house referral  Clinical Social Worker      DC Planning Services  CM consult          Status of service:  In process, will continue to follow Medicare Important Message given?  NA - LOS <3 / Initial given by admissions (If response is "NO", the following Medicare IM given date fields will be blank) Per UR Regulation:  Reviewed for med. necessity/level of care/duration of stay Comments:  04/13/2011- B Winston Misner RN,BSN,MHA

## 2012-04-12 NOTE — Progress Notes (Signed)
Inpatient Diabetes Program Recommendations  AACE/ADA: New Consensus Statement on Inpatient Glycemic Control (2013)  Target Ranges:  Prepandial:   less than 140 mg/dL      Peak postprandial:   less than 180 mg/dL (1-2 hours)      Critically ill patients:  140 - 180 mg/dL   [place] for Visit: Pt takes meal coverage at home, documented as 18 units  breakfast, 16 units before lunch, and Results for GERRITT, GALENTINE (MRN [id number]) as of 04/12/2012 10:44  Ref. Range 04/11/2012 08:03 04/11/2012 11:49 04/11/2012 18:24 04/11/2012 21:38  Glucose-Capillary Latest Range: 70-99 mg/dL 914 (H) 782 (H) 956 (H) 173 (H)   Inpatient Diabetes Program Recommendations Insulin - Meal Coverage: Add meal coverage insulin if pt eats >50% meal - Novolog 3 units tidwc  Note: Agree with note from Diabetes coordinator's note from yesterday.. Fasting glucose normal requiring no correction, then high in 200's before lunch, then normal before supper, then high at HS. Thank you, Lenor Coffin, RN, CNS, Diabetes Coordinator [phone])

## 2012-04-12 NOTE — Progress Notes (Signed)
Pt screaming, demanding wash cloths and demanding to get out of bed, inconsolable. Pt was in chair after pushing his way out of bed with staff in room, but had to be placed back in bed d/t frequent chair alarms and safety risk. Pt ripped out IV and became more agitated when RN attempted to re-insert IV. Pt refusing IV restart at this time. MD notified, new orders received, will continue to monitor.

## 2012-04-12 NOTE — Progress Notes (Signed)
[facility] PROGRESS NOTE  Delfino Radu WUJ:[id number] DOB: 07-01-1927 DOA: 04/10/2012 PCP: [facility], MD  Assessment/Plan: 1. Probable acute CHF  -will continue lasix -cardiac enzymes neg -echo with EF 55%  And grade 1diastolic dysfunction  -clinically improved, further bump in cr today- will hold lasix 2. DM -continue lantus and SSI 3. Dementia -continue outpt meds, per daughter recently started on seroquel- and dose adjusted to 50 and with prn doses to control his behavioral problems, will continue. I have scheduled a 1:30 dose per daughter's request as he gets at home. Also on zoloft. 4.ARF -cr continue to trend up with diuresis, will hold lasix   Code Status: DNR Family Communication: Daughter at bedside Disposition Plan: daughter wants pt to go to to SNF when ready   Consultants:  none  Procedures:  none  Antibiotics:  none  HPI/Subjective: Pt much more alert this am, states breathing better  Objective: Filed Vitals:   04/11/12 1400 04/11/12 2107 04/12/12 0500 04/12/12 0505  BP: 124/57 96/55  103/57  Pulse: 80 91  94  Temp: 97.8 F (36.6 C) 98.4 F (36.9 C)  97.6 F (36.4 C)  TempSrc: Oral Oral  Oral  Resp: 20 22  20   Height:      Weight:   104.7 kg (230 lb 13.2 oz)   SpO2: 96% 96%  92%    Intake/Output Summary (Last 24 hours) at 04/12/12 1106 Last data filed at 04/12/12 0700  Gross per 24 hour  Intake    490 ml  Output    800 ml  Net   -310 ml   Filed Weights   04/10/12 2144 04/11/12 0500 04/12/12 0500  Weight: 108 kg (238 lb 1.6 oz) 106.6 kg (235 lb 0.2 oz) 104.7 kg (230 lb 13.2 oz)    Exam:   General: Elderly male, sleepy but easily aroused in NAD  Cardiovascular: RRR, nl s1s2  Respiratory: decreased BS at bases, no crackles, no wheezes  Abdomen: soft,+BS NT/ND  EXT: trace edema, no cyanosis  Data Reviewed: Basic Metabolic Panel:  Recent Labs Lab 04/10/12 1611 04/10/12 2316 04/11/12 0255 04/12/12 0425   NA 138  --  139 135  K 4.9  --  4.3 4.5  CL 96  --  97 93*  CO2 33*  --  34* 35*  GLUCOSE 79  --  132* 141*  BUN 17  --  20 27*  CREATININE 1.34 1.55* 1.44* 1.70*  CALCIUM 9.0  --  9.0 9.4  MG  --   --  1.9  --    Liver Function Tests:  Recent Labs Lab 04/10/12 1611  AST 46*  ALT 13  ALKPHOS 107  BILITOT 0.4  PROT 7.1  ALBUMIN 3.4*   No results found for this basename: LIPASE, AMYLASE,  in the last 168 hours No results found for this basename: AMMONIA,  in the last 168 hours CBC:  Recent Labs Lab 04/10/12 1611 04/10/12 2316  WBC 8.7 8.5  NEUTROABS 5.4  --   HGB 12.8* 13.1  HCT 41.7 43.1  MCV 89.7 89.4  PLT 213 217   Cardiac Enzymes:  Recent Labs Lab 04/10/12 2316 04/11/12 0235 04/11/12 0845  TROPONINI <0.30 <0.30 <0.30   BNP (last 3 results)  Recent Labs  04/10/12 1611  PROBNP 1591.0*   CBG:  Recent Labs Lab 04/10/12 2147 04/11/12 0803 04/11/12 1149 04/11/12 1824 04/11/12 2138  GLUCAP 265* 127* 284* 143* 173*    Recent Results (from the past  240 hour(s))  URINE CULTURE     Status: None   Collection Time    04/10/12  3:20 PM      Result Value Range Status   Specimen Description URINE, CLEAN CATCH   Final   Special Requests NONE   Final   Culture  Setup Time 04/11/2012 03:09   Final   [place] Count NO GROWTH   Final   Culture NO GROWTH   Final   Report Status 04/11/2012 FINAL   Final     Studies: Dg Chest 2 View  04/10/2012  *RADIOLOGY REPORT*  Clinical Data: Short of breath, chest pain for 2 days, dementia  CHEST - 2 VIEW  Comparison: Portable chest x-ray of 12/11/2010  Findings: Moderate cardiomegaly is stable, and there is mild pulmonary vascular congestion present.  Small effusions cannot be excluded.  No skeletal abnormality is seen.  Fixation device is noted within the right humeral head and neck.  IMPRESSION: Cardiomegaly with mild pulmonary vascular congestion.   Original Report Authenticated By: Dwyane Dee, M.D.     Scheduled  Meds: . aspirin EC  81 mg Oral BID  . atorvastatin  40 mg Oral q1800  . calcium-vitamin D  1 tablet Oral BID WC  . furosemide  20 mg Intravenous Daily  . heparin  5,000 Units Subcutaneous Q8H  . insulin aspart  0-15 Units Subcutaneous TID WC  . insulin aspart  0-5 Units Subcutaneous QHS  . insulin glargine  30 Units Subcutaneous QHS  . QUEtiapine  50 mg Oral BID  . sertraline  50 mg Oral q morning - 10a  . sodium chloride  3 mL Intravenous Q12H   Continuous Infusions: . sodium chloride 10 mL/hr at 04/10/12 2258    Active Problems:   SOB (shortness of breath)   Edema   Diabetes    Time spent: >25MINS    Kela Millin  [facility] Pager [phone]. If 8PM-8AM, please contact night-coverage at [email or web address], password [facility] 04/12/2012, 11:06 AM  LOS: 2 days

## 2012-04-12 NOTE — Progress Notes (Signed)
Physical Therapy Treatment Patient Details Name: Brett Harris MRN: [id number] DOB: [birth year] Today's Date: 04/12/2012 Time: 4098-1191 PT Time Calculation (min): 17 min  PT Assessment / Plan / Recommendation Comments on Treatment Session  Pt did agree to ambulate today, however very unsteady, even with RW due to walking with it too far ahead of him.  Also noted SOB during ambulation.  See ambulation section for O2 sats.  PT continues to recommend SNF for follow up at D/C.    Follow Up Recommendations  SNF;Supervision/Assistance - 24 hour     Does the patient have the potential to tolerate intense rehabilitation     Barriers to Discharge        Equipment Recommendations  Rolling walker with 5" wheels    Recommendations for Other Services OT consult  Frequency Min 3X/week   Plan Discharge plan remains appropriate    Precautions / Restrictions Precautions Precautions: Fall Precaution Comments: monitor O2 Restrictions Weight Bearing Restrictions: No   Pertinent Vitals/Pain No stated pain    Mobility  Bed Mobility Bed Mobility: Supine to Sit Supine to Sit: 3: Mod assist;2: Max assist;HOB elevated Details for Bed Mobility Assistance: Continues to require increased assist for trunk to obtain sitting position.  Also some assist with LEs.  Max cues for technique, safety and attending to task.  Transfers Transfers: Sit to Stand;Stand to Sit;Stand Pivot Transfers Sit to Stand: 3: Mod assist;From elevated surface;With upper extremity assist;From bed Stand to Sit: 4: Min assist;With upper extremity assist;To bed Details for Transfer Assistance: Assist to rise, steady and ensure controlled descent with MAX cues for hand placement and safety.  Ambulation/Gait Ambulation/Gait Assistance: 3: Mod assist Ambulation Distance (Feet): 80 Feet Assistive device: Rolling walker Ambulation/Gait Assistance Details: Assist to steady throughout with increased assist also for negotiating RW.  MAX  cues for maintaining position inside of RW, however pt unable to fully follow cues and walks with RW too far ahead of him.  Also noted some SOB during ambulation with one standing rest break.  Ambulated on RA with SaO2 at 88% following ambulation.  Reapplied 2 L O2 once back in room.  Gait Pattern: Step-through pattern;Decreased stride length;Trunk flexed General Gait Details: Pt very unsteady and would recommend +2 for safety.  Stairs: No    Exercises     PT Diagnosis:    PT Problem List:   PT Treatment Interventions:     PT Goals Acute Rehab PT Goals PT Goal Formulation: Patient unable to participate in goal setting Time For Goal Achievement: 04/25/12 Potential to Achieve Goals: Fair Pt will go Supine/Side to Sit: with supervision PT Goal: Supine/Side to Sit - Progress: Progressing toward goal Pt will go Sit to Supine/Side: with supervision PT Goal: Sit to Supine/Side - Progress: Progressing toward goal Pt will go Sit to Stand: with supervision PT Goal: Sit to Stand - Progress: Progressing toward goal Pt will go Stand to Sit: with supervision PT Goal: Stand to Sit - Progress: Progressing toward goal Pt will Ambulate: 51 - 150 feet;with supervision;with least restrictive assistive device PT Goal: Ambulate - Progress: Progressing toward goal  Visit Information  Last PT Received On: 04/12/12 Assistance Needed: +2 (for O2 with amb, safety)    Subjective Data  Subjective: Help me up Patient Stated Goal: n/a   Cognition  Cognition Overall Cognitive Status: No family/caregiver present to determine baseline cognitive functioning Arousal/Alertness: Lethargic Orientation Level: Disoriented to;Place;Situation;Time Behavior During Session: Lethargic    Balance     End of Session PT -  End of Session Equipment Utilized During Treatment: Gait belt Activity Tolerance: Patient limited by fatigue Patient left: in bed;with call bell/phone within reach;with bed alarm set;with nursing in  room Nurse Communication: Mobility status   GP     Vista Deck 04/12/2012, 2:36 PM

## 2012-04-13 LAB — GLUCOSE, CAPILLARY
Glucose-Capillary: 146 mg/dL — ABNORMAL HIGH (ref 70–99)
Glucose-Capillary: 177 mg/dL — ABNORMAL HIGH (ref 70–99)

## 2012-04-13 LAB — BASIC METABOLIC PANEL
BUN: 25 mg/dL — ABNORMAL HIGH (ref 6–23)
Chloride: 91 mEq/L — ABNORMAL LOW (ref 96–112)
GFR calc Af Amer: 54 mL/min — ABNORMAL LOW (ref >90)
GFR calc non Af Amer: 46 mL/min — ABNORMAL LOW (ref >90)
Potassium: 4.3 mEq/L (ref 3.5–5.1)
Sodium: 135 mEq/L (ref 135–145)

## 2012-04-13 MED ORDER — FUROSEMIDE 40 MG PO TABS
40.0000 mg | ORAL_TABLET | Freq: Every day | ORAL | Status: DC
  Administered 2012-04-13: 40 mg via ORAL
  Filled 2012-04-13 (×2): qty 1

## 2012-04-13 MED ORDER — LORAZEPAM 2 MG/ML IJ SOLN
0.5000 mg | Freq: Once | INTRAMUSCULAR | Status: AC
  Administered 2012-04-13: 0.5 mg via INTRAVENOUS
  Filled 2012-04-13: qty 1

## 2012-04-13 MED ORDER — QUETIAPINE 12.5 MG HALF TABLET
12.5000 mg | ORAL_TABLET | Freq: Every day | ORAL | Status: DC | PRN
  Administered 2012-04-13: 12.5 mg via ORAL
  Filled 2012-04-13 (×2): qty 1

## 2012-04-13 NOTE — Progress Notes (Signed)
[facility] PROGRESS NOTE  Masoud Forlenza BJY:[id number] DOB: 15-May-1927 DOA: 04/10/2012 PCP: [facility], [place]  Assessment/Plan: 1. Probable acute CHF  -will continue lasix -cardiac enzymes neg -echo with EF 55%  And grade 1diastolic dysfunction  -cr improved today, will resume lasix- PO and continue close monitoring  2. DM -continue lantus and SSI 3. Dementia -continue outpt meds, per daughter recently started on seroquel- and dose adjusted to 50 and with prn doses to control his behavioral problems, will continue. continue scheduled 1:30 dose per daughter's request as he gets at home. Also on zoloft. 4.ARF -cr improved to 1.34 to today 2/22, follow  Code Status: DNR Family Communication: Daughter at bedside Disposition Plan: daughter wants pt to go to to SNF when ready   Consultants:  none  Procedures:  none  Antibiotics:  none  HPI/Subjective: Pt states he feels all right this am, no new c/o reported.  Objective: Filed Vitals:   04/12/12 2104 04/13/12 0603 04/13/12 0800 04/13/12 1334  BP: 126/59 125/60 124/56 124/53  Pulse: 80 89 74 77  Temp: 97.7 F (36.5 C) 97.7 F (36.5 C) 98.4 F (36.9 C) 98.4 F (36.9 C)  TempSrc: Oral Oral Oral Axillary  Resp: 20 20 20 20   Height:      Weight:  104.8 kg (231 lb 0.7 oz)    SpO2: 97% 98% 96% 92%    Intake/Output Summary (Last 24 hours) at 04/13/12 1410 Last data filed at 04/13/12 1000  Gross per 24 hour  Intake    240 ml  Output   1525 ml  Net  -1285 ml   Filed Weights   04/11/12 0500 04/12/12 0500 04/13/12 0603  Weight: 106.6 kg (235 lb 0.2 oz) 104.7 kg (230 lb 13.2 oz) 104.8 kg (231 lb 0.7 oz)    Exam:   General: Elderly male, sleepy but easily aroused in NAD  Cardiovascular: RRR, nl s1s2  Respiratory: decreased BS at bases, no crackles, no wheezes  Abdomen: soft,+BS NT/ND  EXT: no edema, no cyanosis  Data Reviewed: Basic Metabolic Panel:  Recent Labs Lab 04/10/12 1611  04/10/12 2316 04/11/12 0255 04/12/12 0425 04/13/12 0443  NA 138  --  139 135 135  K 4.9  --  4.3 4.5 4.3  CL 96  --  97 93* 91*  CO2 33*  --  34* 35* 36*  GLUCOSE 79  --  132* 141* 123*  BUN 17  --  20 27* 25*  CREATININE 1.34 1.55* 1.44* 1.70* 1.36*  CALCIUM 9.0  --  9.0 9.4 8.8  MG  --   --  1.9  --   --    Liver Function Tests:  Recent Labs Lab 04/10/12 1611  AST 46*  ALT 13  ALKPHOS 107  BILITOT 0.4  PROT 7.1  ALBUMIN 3.4*   No results found for this basename: LIPASE, AMYLASE,  in the last 168 hours No results found for this basename: AMMONIA,  in the last 168 hours CBC:  Recent Labs Lab 04/10/12 1611 04/10/12 2316  WBC 8.7 8.5  NEUTROABS 5.4  --   HGB 12.8* 13.1  HCT 41.7 43.1  MCV 89.7 89.4  PLT 213 217   Cardiac Enzymes:  Recent Labs Lab 04/10/12 2316 04/11/12 0235 04/11/12 0845  TROPONINI <0.30 <0.30 <0.30   BNP (last 3 results)  Recent Labs  04/10/12 1611  PROBNP 1591.0*   CBG:  Recent Labs Lab 04/12/12 1716 04/12/12 2141 04/13/12 0802 04/13/12 1128 04/13/12 1315  GLUCAP 90 159* 146* 177* 153*    Recent Results (from the past 240 hour(s))  URINE CULTURE     Status: None   Collection Time    04/10/12  3:20 PM      Result Value Range Status   Specimen Description URINE, CLEAN CATCH   Final   Special Requests NONE   Final   Culture  Setup Time 04/11/2012 03:09   Final   Colony Count NO GROWTH   Final   Culture NO GROWTH   Final   Report Status 04/11/2012 FINAL   Final     Studies: No results found.  Scheduled Meds: . aspirin EC  81 mg Oral BID  . atorvastatin  40 mg Oral q1800  . calcium-vitamin D  1 tablet Oral BID WC  . furosemide  40 mg Oral Daily  . heparin  5,000 Units Subcutaneous Q8H  . insulin aspart  0-15 Units Subcutaneous TID WC  . insulin aspart  0-5 Units Subcutaneous QHS  . insulin glargine  30 Units Subcutaneous QHS  . QUEtiapine  12.5 mg Oral Custom  . QUEtiapine  50 mg Oral BID  . sertraline  50 mg  Oral q morning - 10a  . sodium chloride  3 mL Intravenous Q12H   Continuous Infusions: . sodium chloride 10 mL/hr at 04/10/12 2258    Active Problems:   SOB (shortness of breath)   Edema   Diabetes    Time spent: >25MINS    Kela Millin  [facility] Pager [phone]. If 8PM-8AM, please contact night-coverage at [email or web address], password [facility] 04/13/2012, 2:10 PM  LOS: 3 days

## 2012-04-14 LAB — BASIC METABOLIC PANEL
BUN: 26 mg/dL — ABNORMAL HIGH (ref 6–23)
CO2: 35 mEq/L — ABNORMAL HIGH (ref 19–32)
Calcium: 8.3 mg/dL — ABNORMAL LOW (ref 8.4–10.5)
Creatinine, Ser: 1.46 mg/dL — ABNORMAL HIGH (ref 0.50–1.35)
Glucose, Bld: 137 mg/dL — ABNORMAL HIGH (ref 70–99)

## 2012-04-14 LAB — GLUCOSE, CAPILLARY
Glucose-Capillary: 167 mg/dL — ABNORMAL HIGH (ref 70–99)
Glucose-Capillary: 188 mg/dL — ABNORMAL HIGH (ref 70–99)

## 2012-04-14 MED ORDER — FUROSEMIDE 20 MG PO TABS
20.0000 mg | ORAL_TABLET | Freq: Every day | ORAL | Status: DC
  Administered 2012-04-14 – 2012-04-15 (×2): 20 mg via ORAL
  Filled 2012-04-14 (×2): qty 1

## 2012-04-14 NOTE — Progress Notes (Signed)
Clinical Social Work Department CLINICAL SOCIAL WORK PLACEMENT NOTE 04/14/2012  Patient:  Brett Harris, Brett Harris  Account Number:  [id number] Admit date:  04/10/2012  Clinical Social Worker:  Doroteo Glassman  Date/time:  04/14/2012 02:06 PM  Clinical Social Work is seeking post-discharge placement for this patient at the following level of care:   SKILLED NURSING   (*CSW will update this form in Epic as items are completed)   Will give with offers  Patient/family provided with [facility] Department of Clinical Social Work's list of facilities offering this level of care within the geographic area requested by the patient (or if unable, by the patient's family).  04/14/2012  Patient/family informed of their freedom to choose among providers that offer the needed level of care, that participate in Medicare, Medicaid or managed care program needed by the patient, have an available bed and are willing to accept the patient.  04/14/2012  Patient/family informed of [facility]' ownership interest in [facility], as well as of the fact that they are under no obligation to receive care at this facility.  PASARR submitted to EDS on 04/14/2012 PASARR number received from EDS on 04/14/2012  FL2 transmitted to all facilities in geographic area requested by pt/family on  04/14/2012 FL2 transmitted to all facilities within larger geographic area on   Patient informed that his/her managed care company has contracts with or will negotiate with  certain facilities, including the following:     Patient/family informed of bed offers received:   Patient chooses bed at  Physician recommends and patient chooses bed at    Patient to be transferred to  on   Patient to be transferred to facility by   The following physician request were entered in Epic:   Additional Comments:  CSW to continue to follow.  Providence Crosby, LCSWA Clinical Social Work [phone]

## 2012-04-14 NOTE — Progress Notes (Signed)
[facility] PROGRESS NOTE  Daden Claggett ZOX:[id number] DOB: [birth year] DOA: 04/10/2012 PCP: [facility], MD  Assessment/Plan: 1. Probable acute CHF  -cardiac enzymes neg -echo with EF 55%  And grade 1diastolic dysfunction  -cr trending up today again, will laisx dose and follow 2. DM -continue lantus and SSI 3. Dementia -continue outpt meds, per daughter recently started on seroquel- and dose adjusted to 50 and with prn doses to control his behavioral problems, will continue. continue scheduled 1:30 dose per daughter's request as he gets at home. Also on zoloft. 4.ARF -cr improved to 1.34 to today 2/22, follow  Code Status: DNR Family Communication: Daughter at bedside Disposition Plan: daughter wants pt to go to to SNF when ready   Consultants:  none  Procedures:  none  Antibiotics:  none  HPI/Subjective: Pt denies any new c/o  Objective: Filed Vitals:   04/13/12 0800 04/13/12 1334 04/13/12 2054 04/14/12 0415  BP: 124/56 124/53 120/48 107/57  Pulse: 74 77 75 95  Temp: 98.4 F (36.9 C) 98.4 F (36.9 C) 98.5 F (36.9 C) 98.6 F (37 C)  TempSrc: Oral Axillary Oral Oral  Resp: 20 20 20 20   Height:      Weight:      SpO2: 96% 92% 95% 97%    Intake/Output Summary (Last 24 hours) at 04/14/12 1154 Last data filed at 04/14/12 0630  Gross per 24 hour  Intake    960 ml  Output    500 ml  Net    460 ml   Filed Weights   04/11/12 0500 04/12/12 0500 04/13/12 0603  Weight: 106.6 kg (235 lb 0.2 oz) 104.7 kg (230 lb 13.2 oz) 104.8 kg (231 lb 0.7 oz)    Exam:   General: Elderly male, sleepy but easily aroused in NAD  Cardiovascular: RRR, nl s1s2  Respiratory: decreased BS at bases, no crackles, no wheezes  Abdomen: soft,+BS NT/ND  EXT: trace edema, no cyanosis  Data Reviewed: Basic Metabolic Panel:  Recent Labs Lab 04/10/12 1611 04/10/12 2316 04/11/12 0255 04/12/12 0425 04/13/12 0443 04/14/12 0501  NA 138  --  139 135 135  133*  K 4.9  --  4.3 4.5 4.3 4.1  CL 96  --  97 93* 91* 90*  CO2 33*  --  34* 35* 36* 35*  GLUCOSE 79  --  132* 141* 123* 137*  BUN 17  --  20 27* 25* 26*  CREATININE 1.34 1.55* 1.44* 1.70* 1.36* 1.46*  CALCIUM 9.0  --  9.0 9.4 8.8 8.3*  MG  --   --  1.9  --   --   --    Liver Function Tests:  Recent Labs Lab 04/10/12 1611  AST 46*  ALT 13  ALKPHOS 107  BILITOT 0.4  PROT 7.1  ALBUMIN 3.4*   No results found for this basename: LIPASE, AMYLASE,  in the last 168 hours No results found for this basename: AMMONIA,  in the last 168 hours CBC:  Recent Labs Lab 04/10/12 1611 04/10/12 2316  WBC 8.7 8.5  NEUTROABS 5.4  --   HGB 12.8* 13.1  HCT 41.7 43.1  MCV 89.7 89.4  PLT 213 217   Cardiac Enzymes:  Recent Labs Lab 04/10/12 2316 04/11/12 0235 04/11/12 0845  TROPONINI <0.30 <0.30 <0.30   BNP (last 3 results)  Recent Labs  04/10/12 1611  PROBNP 1591.0*   CBG:  Recent Labs Lab 04/13/12 0802 04/13/12 1128 04/13/12 1315 04/13/12 1636 04/13/12 2053  GLUCAP  146* 177* 153* 136* 185*    Recent Results (from the past 240 hour(s))  URINE CULTURE     Status: None   Collection Time    04/10/12  3:20 PM      Result Value Range Status   Specimen Description URINE, CLEAN CATCH   Final   Special Requests NONE   Final   Culture  Setup Time 04/11/2012 03:09   Final   Colony Count NO GROWTH   Final   Culture NO GROWTH   Final   Report Status 04/11/2012 FINAL   Final     Studies: No results found.  Scheduled Meds: . aspirin EC  81 mg Oral BID  . atorvastatin  40 mg Oral q1800  . calcium-vitamin D  1 tablet Oral BID WC  . furosemide  20 mg Oral Daily  . heparin  5,000 Units Subcutaneous Q8H  . insulin aspart  0-15 Units Subcutaneous TID WC  . insulin aspart  0-5 Units Subcutaneous QHS  . insulin glargine  30 Units Subcutaneous QHS  . QUEtiapine  12.5 mg Oral Custom  . QUEtiapine  50 mg Oral BID  . sertraline  50 mg Oral q morning - 10a  . sodium chloride   3 mL Intravenous Q12H   Continuous Infusions: . sodium chloride 10 mL/hr at 04/10/12 2258    Active Problems:   SOB (shortness of breath)   Edema   Diabetes    Time spent: >25MINS    Kela Millin  [facility] Pager [phone]. If 8PM-8AM, please contact night-coverage at [email or web address], password [facility] 04/14/2012, 11:54 AM  LOS: 4 days

## 2012-04-14 NOTE — Progress Notes (Signed)
Clinical Social Work Department BRIEF PSYCHOSOCIAL ASSESSMENT 04/14/2012  Patient:  Brett Harris, Brett Harris     Account Number:  [id number]     Admit date:  04/10/2012  Clinical Social Worker:  Doroteo Glassman  Date/Time:  04/14/2012 02:03 PM  Referred by:  Physician  Date Referred:  04/14/2012 Referred for  SNF Placement   Other Referral:   Interview type:  Other - See comment Other interview type:   Pt's daughter, Mrs. Lindroth, via phone.    PSYCHOSOCIAL DATA Living Status:  FAMILY Admitted from facility:   Level of care:   Primary support name:  Mrs. Gladwin Primary support relationship to patient:  CHILD, ADULT Degree of support available:   strong    CURRENT CONCERNS Current Concerns  Post-Acute Placement   Other Concerns:    SOCIAL WORK ASSESSMENT / PLAN Pt has dementia and unable to participate in meaningful conversation.    Spoke with Pt's daughter, with whom he resides, via phone.    Pt's daughter aware of SNF recommendation and agreeable. Pt's daughter asking that CSW send Pt's information to both [place] and [employer], as they reside in [place] and want as many options for SNF that may be available to them.    CSW thanked Mrs. Lipton for her time.   Assessment/plan status:  Psychosocial Support/Ongoing Assessment of Needs Other assessment/ plan:   Information/referral to community resources:   Will give with offers.    PATIENT'S/FAMILY'S RESPONSE TO PLAN OF CARE: Mrs. Nilan thanked CSW for time and assistance.   CSW to continue to follow.  Providence Crosby, LCSWA Clinical Social Work [phone]

## 2012-04-15 DIAGNOSIS — F0391 Unspecified dementia with behavioral disturbance: Secondary | ICD-10-CM

## 2012-04-15 LAB — BASIC METABOLIC PANEL
Calcium: 8 mg/dL — ABNORMAL LOW (ref 8.4–10.5)
Creatinine, Ser: 1.41 mg/dL — ABNORMAL HIGH (ref 0.50–1.35)
GFR calc non Af Amer: 44 mL/min — ABNORMAL LOW (ref >90)
Sodium: 133 mEq/L — ABNORMAL LOW (ref 135–145)

## 2012-04-15 MED ORDER — QUETIAPINE 12.5 MG HALF TABLET: ORAL_TABLET | ORAL | Status: DC

## 2012-04-15 MED ORDER — QUETIAPINE 12.5 MG HALF TABLET: 12.5000 mg | ORAL_TABLET | Freq: Every day | ORAL | Status: DC | PRN

## 2012-04-15 MED ORDER — FUROSEMIDE 20 MG PO TABS: 20.0000 mg | ORAL_TABLET | Freq: Every day | ORAL | Status: AC

## 2012-04-15 MED ORDER — INSULIN ASPART 100 UNIT/ML ~~LOC~~ SOLN: 0.0000 [IU] | Freq: Three times a day (TID) | SUBCUTANEOUS | Status: DC

## 2012-04-15 NOTE — Progress Notes (Signed)
Patient has a bed at [facility], anticipating discharge today. Patient's daughter, [name] at bedside aware & will be completing admission paperwork at the facility this afternoon prior to discharge.   Unice Bailey, LCSW [facility] Clinical Social Worker cell #: [phone]

## 2012-04-15 NOTE — Discharge Summary (Signed)
Physician Discharge Summary  Brett Harris ZOX:[id number] DOB: 04-May-1927 DOA: 04/10/2012  PCP: [facility], [place]  Admit date: 04/10/2012 Discharge date: 04/15/2012  Time spent: >30 minutes  Recommendations for Outpatient Follow-up:  Nursing home M.D. in one to 2 days Discharge Diagnoses:  Active Problems:   SOB (shortness of breath)   Edema   Diabetes   Discharge Condition: Improved/stable  Diet recommendation: Modified carbohydrate  Filed Weights   04/12/12 0500 04/13/12 0603 04/15/12 0525  Weight: 104.7 kg (230 lb 13.2 oz) 104.8 kg (231 lb 0.7 oz) 105 kg (231 lb 7.7 oz)    History of present illness:  Brett Harris is a 79 y.o. male  Who is demented and can not provide much history. He is also very hard of hearing. Daughter relays that has had increasing SOB and weakness over the last few weeks. No fever. + cough. Patient does not eat or drink much per daughter. No chest pain. Daughter relays that patient has had LE edema and the inability to lay flat while sleeping. It was that the patient was able to ambulate with cane at home but had a tendency to wander. He was admitted for further evaluation and management.   Hospital Course:  1.Probable acute diastolic CHF  As discussed above, upon admission the patient had a chest x-ray done which showed cardiomegaly with mild pulmonary vascular congestion and BNP was done and came back elevated at 1591. The patient was started on IV Lasix with improvement in his shortness of breath and of lower extremity edema. Cardiac enzymes were cycled and came back negative. His renal function was monitored with diuresis and his creatinine peaked at 1.70 on 2/21 and following that his Lasix was held and subsequently a resumed at a decreased dose(20 mg daily)as his creatinine improved. He is continued to do well and his creatinine today prior to discharge is 1.41. A 2-D echocardiogram was done and revealed an ejection fraction of 55% and  findings consistent with abnormal left ventricle are relaxation-grade 1 diastolic dysfunction. Nursing M.D. to continue to monitor his fluid status and renal function and further adjust his Lasix dose as clinically appropriate.-2.DM -continue lantus and SSI  3.Dementia -continue outpt meds, per daughter recently started on seroquel- and dose adjusted to 50 and with prn doses to control his behavioral problems, will continue. continue scheduled 1:30 dose per daughter's request as he gets at home. Also on zoloft.  4.ARF  -As discussed above, his creatinine today prior to discharge is 1.41. Nursing home M.D. to continue to monitor fluid status, renal function and adjust diuretics as appropriate.   Procedures: Echo Impressions:  - Technically difficult study with poor acoustic windows. Normal LV size with EF 55%. Mild to moderate LVH. Images not adequate for wall motion analysis. Normal RV size and systolic function.grade 1 diastolic dysfunction noted.     Consultations:  none  Discharge Exam: Filed Vitals:   04/14/12 0415 04/14/12 1414 04/14/12 2100 04/15/12 0525  BP: 107/57 119/66 106/57 102/59  Pulse: 95 75 80 80  Temp: 98.6 F (37 C) 98.4 F (36.9 C) 98.1 F (36.7 C) 97.8 F (36.6 C)  TempSrc: Oral Oral Oral Oral  Resp: 20 18 20 20   Height:      Weight:    105 kg (231 lb 7.7 oz)  SpO2: 97% 98% 97% 91%    Exam:  General: Elderly male, sleepy but easily aroused in NAD  Cardiovascular: RRR, nl s1s2  Respiratory: decreased BS at bases, no  crackles, no wheezes  Abdomen: soft,+BS NT/ND  EXT: trace edema, no cyanosis     Discharge Instructions  Discharge Orders   Future Orders Complete By Expires     Diet Carb Modified  As directed     Increase activity slowly  As directed         Medication List    STOP taking these medications       insulin regular 100 units/mL injection  Commonly known as:  NOVOLIN R,HUMULIN R      TAKE these medications       aspirin  EC 81 MG tablet  Take 81 mg by mouth 2 (two) times daily.     atorvastatin 40 MG tablet  Commonly known as:  LIPITOR  Take 40 mg by mouth every evening.     calcium-vitamin D 500-200 MG-UNIT per tablet  Commonly known as:  OSCAL WITH D  Take 1 tablet by mouth 2 (two) times daily.     furosemide 20 MG tablet  Commonly known as:  LASIX  Take 1 tablet (20 mg total) by mouth daily.     insulin aspart 100 UNIT/ML injection  Commonly known as:  novoLOG  Inject 0-15 Units into the skin 3 (three) times daily with meals.     insulin glargine 100 UNIT/ML injection  Commonly known as:  LANTUS  Inject 30 Units into the skin at bedtime.     omeprazole 20 MG capsule  Commonly known as:  PRILOSEC  Take 20 mg by mouth daily.     QUEtiapine 12.5 mg Tabs  Commonly known as:  SEROQUEL  AT 1:30PM DAILY     QUEtiapine 12.5 mg Tabs  Commonly known as:  SEROQUEL  Take 0.5 tablets (12.5 mg total) by mouth daily as needed (mood).     QUEtiapine 50 MG tablet  Commonly known as:  SEROQUEL  Take 50 mg by mouth 2 (two) times daily.     senna 8.6 MG tablet  Commonly known as:  SENOKOT  Take 2 tablets by mouth every Monday, Wednesday, and Friday.     sertraline 50 MG tablet  Commonly known as:  ZOLOFT  Take 50 mg by mouth every morning.           Follow-up Information   Please follow up. (snf MD in 1-2days )        The results of significant diagnostics from this hospitalization (including imaging, microbiology, ancillary and laboratory) are listed below for reference.    Significant Diagnostic Studies: Dg Chest 2 View  04/10/2012  *RADIOLOGY REPORT*  Clinical Data: Short of breath, chest pain for 2 days, dementia  CHEST - 2 VIEW  Comparison: Portable chest x-ray of 12/11/2010  Findings: Moderate cardiomegaly is stable, and there is mild pulmonary vascular congestion present.  Small effusions cannot be excluded.  No skeletal abnormality is seen.  Fixation device is noted within the  right humeral head and neck.  IMPRESSION: Cardiomegaly with mild pulmonary vascular congestion.   Original Report Authenticated By: Dwyane Dee, M.D.     Microbiology: Recent Results (from the past 240 hour(s))  URINE CULTURE     Status: None   Collection Time    04/10/12  3:20 PM      Result Value Range Status   Specimen Description URINE, CLEAN CATCH   Final   Special Requests NONE   Final   Culture  Setup Time 04/11/2012 03:09   Final   Colony Count NO GROWTH   Final  Culture NO GROWTH   Final   Report Status 04/11/2012 FINAL   Final     Labs: Basic Metabolic Panel:  Recent Labs Lab 04/10/12 1611  04/11/12 0255 04/12/12 0425 04/13/12 0443 04/14/12 0501 04/15/12 0420  NA 138  --  139 135 135 133* 133*  K 4.9  --  4.3 4.5 4.3 4.1 4.1  CL 96  --  97 93* 91* 90* 91*  CO2 33*  --  34* 35* 36* 35* 38*  GLUCOSE 79  --  132* 141* 123* 137* 126*  BUN 17  --  20 27* 25* 26* 25*  CREATININE 1.34  < > 1.44* 1.70* 1.36* 1.46* 1.41*  CALCIUM 9.0  --  9.0 9.4 8.8 8.3* 8.0*  MG  --   --  1.9  --   --   --   --   < > = values in this interval not displayed. Liver Function Tests:  Recent Labs Lab 04/10/12 1611  AST 46*  ALT 13  ALKPHOS 107  BILITOT 0.4  PROT 7.1  ALBUMIN 3.4*   No results found for this basename: LIPASE, AMYLASE,  in the last 168 hours No results found for this basename: AMMONIA,  in the last 168 hours CBC:  Recent Labs Lab 04/10/12 1611 04/10/12 2316  WBC 8.7 8.5  NEUTROABS 5.4  --   HGB 12.8* 13.1  HCT 41.7 43.1  MCV 89.7 89.4  PLT 213 217   Cardiac Enzymes:  Recent Labs Lab 04/10/12 2316 04/11/12 0235 04/11/12 0845  TROPONINI <0.30 <0.30 <0.30   BNP: BNP (last 3 results)  Recent Labs  04/10/12 1611  PROBNP 1591.0*   CBG:  Recent Labs Lab 04/14/12 1201 04/14/12 1624 04/14/12 1709 04/14/12 2103 04/15/12 0750  GLUCAP 132* 167* 188* 137* 99       Signed:  Ronnald Shedden C  [facility] 04/15/2012, 11:22  AM

## 2012-04-15 NOTE — Progress Notes (Signed)
Patient is set to discharge to [facility] today. Patient & daughter, [name] at bedside aware. Discharge packet in [facility] including AVS, FL2, DNR & chart copy. [facility] called for transport.   Clinical Social Work Department CLINICAL SOCIAL WORK PLACEMENT NOTE 04/15/2012  Patient:  Brett Harris, Brett Harris  Account Number:  [id number] Admit date:  04/10/2012  Clinical Social Worker:  Doroteo Glassman  Date/time:  04/14/2012 02:06 PM  Clinical Social Work is seeking post-discharge placement for this patient at the following level of care:   SKILLED NURSING   (*CSW will update this form in Epic as items are completed)     Patient/family provided with [facility] Department of Clinical Social Work's list of facilities offering this level of care within the geographic area requested by the patient (or if unable, by the patient's family).  04/14/2012  Patient/family informed of their freedom to choose among providers that offer the needed level of care, that participate in Medicare, Medicaid or managed care program needed by the patient, have an available bed and are willing to accept the patient.  04/14/2012  Patient/family informed of [facility]' ownership interest in [facility], as well as of the fact that they are under no obligation to receive care at this facility.  PASARR submitted to EDS on 04/14/2012 PASARR number received from EDS on 04/14/2012  FL2 transmitted to all facilities in geographic area requested by pt/family on  04/14/2012 FL2 transmitted to all facilities within larger geographic area on   Patient informed that his/her managed care company has contracts with or will negotiate with  certain facilities, including the following:     Patient/family informed of bed offers received:  04/15/2012 Patient chooses bed at [facility] [place] Physician recommends and patient chooses bed at    [facility] [place] on  04/15/2012 Patient to be transferred to facility by [facility]  The following physician request were entered in Epic:   Additional Comments:  Unice Bailey, LCSW [facility] Clinical Social Worker cell #: [phone]

## 2013-03-15 ENCOUNTER — Encounter: Payer: Self-pay | Admitting: Cardiology

## 2013-03-15 DIAGNOSIS — R0602 Shortness of breath: Secondary | ICD-10-CM | POA: Insufficient documentation

## 2013-03-15 DIAGNOSIS — I5030 Unspecified diastolic (congestive) heart failure: Secondary | ICD-10-CM | POA: Insufficient documentation

## 2013-03-15 DIAGNOSIS — E119 Type 2 diabetes mellitus without complications: Secondary | ICD-10-CM | POA: Insufficient documentation

## 2013-03-15 DIAGNOSIS — I509 Heart failure, unspecified: Secondary | ICD-10-CM | POA: Insufficient documentation

## 2013-03-15 DIAGNOSIS — I10 Essential (primary) hypertension: Secondary | ICD-10-CM | POA: Insufficient documentation

## 2013-03-21 ENCOUNTER — Encounter: Payer: Self-pay | Admitting: Cardiology

## 2013-03-21 ENCOUNTER — Ambulatory Visit (INDEPENDENT_AMBULATORY_CARE_PROVIDER_SITE_OTHER): Payer: Medicare Other | Admitting: Cardiology

## 2013-03-21 VITALS — BP 110/60 | HR 58 | Ht 68.0 in | Wt 231.0 lb

## 2013-03-21 DIAGNOSIS — I5032 Chronic diastolic (congestive) heart failure: Secondary | ICD-10-CM

## 2013-03-21 DIAGNOSIS — E785 Hyperlipidemia, unspecified: Secondary | ICD-10-CM | POA: Insufficient documentation

## 2013-03-21 DIAGNOSIS — I10 Essential (primary) hypertension: Secondary | ICD-10-CM | POA: Insufficient documentation

## 2013-03-21 DIAGNOSIS — E669 Obesity, unspecified: Secondary | ICD-10-CM | POA: Insufficient documentation

## 2013-03-21 NOTE — Patient Instructions (Signed)
Your physician wants you to follow-up in: 6 months with Dr. Skains You will receive a reminder letter in the mail two months in advance. If you don't receive a letter, please call our office to schedule the follow-up appointment.  Your physician recommends that you continue on your current medications as directed. Please refer to the Current Medication list given to you today.  
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
## 2014-01-21 ENCOUNTER — Other Ambulatory Visit: Payer: Self-pay | Admitting: Nurse Practitioner

## 2014-01-21 ENCOUNTER — Ambulatory Visit
Admission: RE | Admit: 2014-01-21 | Discharge: 2014-01-21 | Disposition: A | Discharge status: Home / Self Care | Payer: Medicare Other | Source: Ambulatory Visit | Attending: Nurse Practitioner | Admitting: Nurse Practitioner

## 2014-01-21 DIAGNOSIS — J069 Acute upper respiratory infection, unspecified: Secondary | ICD-10-CM

## 2014-02-19 ENCOUNTER — Other Ambulatory Visit: Payer: Self-pay | Admitting: Nurse Practitioner

## 2014-02-19 ENCOUNTER — Ambulatory Visit
Admission: RE | Admit: 2014-02-19 | Discharge: 2014-02-19 | Disposition: A | Discharge status: Home / Self Care | Payer: Medicare Other | Source: Ambulatory Visit | Attending: Nurse Practitioner | Admitting: Nurse Practitioner

## 2014-02-19 DIAGNOSIS — M25551 Pain in right hip: Secondary | ICD-10-CM

## 2014-02-19 DIAGNOSIS — M545 Low back pain: Secondary | ICD-10-CM

## 2014-02-19 DIAGNOSIS — R0789 Other chest pain: Secondary | ICD-10-CM

## 2014-02-19 DIAGNOSIS — R0781 Pleurodynia: Secondary | ICD-10-CM

## 2014-03-05 ENCOUNTER — Ambulatory Visit
Admission: RE | Admit: 2014-03-05 | Discharge: 2014-03-05 | Disposition: A | Discharge status: Home / Self Care | Payer: Medicare Other | Source: Ambulatory Visit | Attending: Internal Medicine | Admitting: Internal Medicine

## 2014-03-05 ENCOUNTER — Other Ambulatory Visit: Payer: Self-pay | Admitting: Internal Medicine

## 2014-03-05 DIAGNOSIS — R9389 Abnormal findings on diagnostic imaging of other specified body structures: Secondary | ICD-10-CM

## 2014-09-02 ENCOUNTER — Emergency Department (HOSPITAL_COMMUNITY)
Admission: EM | Admit: 2014-09-02 | Discharge: 2014-09-02 | Disposition: A | Discharge status: Home / Self Care | Payer: Medicare Other | Attending: Emergency Medicine | Admitting: Emergency Medicine

## 2014-09-02 ENCOUNTER — Emergency Department (HOSPITAL_COMMUNITY): Payer: Medicare Other

## 2014-09-02 ENCOUNTER — Encounter (HOSPITAL_COMMUNITY): Payer: Self-pay | Admitting: *Deleted

## 2014-09-02 DIAGNOSIS — S0990XA Unspecified injury of head, initial encounter: Secondary | ICD-10-CM

## 2014-09-02 DIAGNOSIS — Y9389 Activity, other specified: Secondary | ICD-10-CM | POA: Insufficient documentation

## 2014-09-02 DIAGNOSIS — I10 Essential (primary) hypertension: Secondary | ICD-10-CM | POA: Insufficient documentation

## 2014-09-02 DIAGNOSIS — M810 Age-related osteoporosis without current pathological fracture: Secondary | ICD-10-CM | POA: Diagnosis not present

## 2014-09-02 DIAGNOSIS — Z87891 Personal history of nicotine dependence: Secondary | ICD-10-CM | POA: Diagnosis not present

## 2014-09-02 DIAGNOSIS — Y9289 Other specified places as the place of occurrence of the external cause: Secondary | ICD-10-CM | POA: Diagnosis not present

## 2014-09-02 DIAGNOSIS — Z7952 Long term (current) use of systemic steroids: Secondary | ICD-10-CM | POA: Insufficient documentation

## 2014-09-02 DIAGNOSIS — W1839XA Other fall on same level, initial encounter: Secondary | ICD-10-CM | POA: Insufficient documentation

## 2014-09-02 DIAGNOSIS — I2510 Atherosclerotic heart disease of native coronary artery without angina pectoris: Secondary | ICD-10-CM | POA: Insufficient documentation

## 2014-09-02 DIAGNOSIS — Z8701 Personal history of pneumonia (recurrent): Secondary | ICD-10-CM | POA: Insufficient documentation

## 2014-09-02 DIAGNOSIS — Z79899 Other long term (current) drug therapy: Secondary | ICD-10-CM | POA: Diagnosis not present

## 2014-09-02 DIAGNOSIS — Z85820 Personal history of malignant melanoma of skin: Secondary | ICD-10-CM | POA: Insufficient documentation

## 2014-09-02 DIAGNOSIS — Z7982 Long term (current) use of aspirin: Secondary | ICD-10-CM | POA: Insufficient documentation

## 2014-09-02 DIAGNOSIS — E119 Type 2 diabetes mellitus without complications: Secondary | ICD-10-CM | POA: Diagnosis not present

## 2014-09-02 DIAGNOSIS — Y998 Other external cause status: Secondary | ICD-10-CM | POA: Diagnosis not present

## 2014-09-02 DIAGNOSIS — F0390 Unspecified dementia without behavioral disturbance: Secondary | ICD-10-CM | POA: Diagnosis not present

## 2014-09-02 DIAGNOSIS — Z794 Long term (current) use of insulin: Secondary | ICD-10-CM | POA: Insufficient documentation

## 2014-09-02 DIAGNOSIS — S3992XA Unspecified injury of lower back, initial encounter: Secondary | ICD-10-CM | POA: Insufficient documentation

## 2014-09-02 DIAGNOSIS — I509 Heart failure, unspecified: Secondary | ICD-10-CM | POA: Diagnosis not present

## 2014-09-02 DIAGNOSIS — W19XXXA Unspecified fall, initial encounter: Secondary | ICD-10-CM

## 2014-09-02 DIAGNOSIS — M545 Low back pain: Secondary | ICD-10-CM

## 2014-09-02 MED ORDER — OXYCODONE-ACETAMINOPHEN 5-325 MG PO TABS
1.0000 | ORAL_TABLET | Freq: Once | ORAL | Status: AC
  Administered 2014-09-02: 1 via ORAL
  Filled 2014-09-02: qty 1

## 2014-09-02 NOTE — ED Notes (Signed)
Patient fell today while [facility] the [facility] today. He states he was reaching for something and fell face first with bruise to right side of his face. Patient was ambulatory on scene and complains of pain to the tailbone.

## 2014-09-02 NOTE — ED Notes (Signed)
Skin tear to right forearm
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
## 2014-11-10 ENCOUNTER — Emergency Department (HOSPITAL_COMMUNITY): Payer: No Typology Code available for payment source

## 2014-11-10 ENCOUNTER — Encounter (HOSPITAL_COMMUNITY): Payer: Self-pay | Admitting: Emergency Medicine

## 2014-11-10 ENCOUNTER — Emergency Department (HOSPITAL_COMMUNITY)
Admission: EM | Admit: 2014-11-10 | Discharge: 2014-11-10 | Disposition: A | Discharge status: Home / Self Care | Payer: No Typology Code available for payment source | Attending: Emergency Medicine | Admitting: Emergency Medicine

## 2014-11-10 DIAGNOSIS — I2510 Atherosclerotic heart disease of native coronary artery without angina pectoris: Secondary | ICD-10-CM | POA: Diagnosis not present

## 2014-11-10 DIAGNOSIS — Z8701 Personal history of pneumonia (recurrent): Secondary | ICD-10-CM | POA: Insufficient documentation

## 2014-11-10 DIAGNOSIS — S32000A Wedge compression fracture of unspecified lumbar vertebra, initial encounter for closed fracture: Secondary | ICD-10-CM | POA: Insufficient documentation

## 2014-11-10 DIAGNOSIS — Z79899 Other long term (current) drug therapy: Secondary | ICD-10-CM | POA: Diagnosis not present

## 2014-11-10 DIAGNOSIS — Y92410 Unspecified street and highway as the place of occurrence of the external cause: Secondary | ICD-10-CM | POA: Insufficient documentation

## 2014-11-10 DIAGNOSIS — I10 Essential (primary) hypertension: Secondary | ICD-10-CM | POA: Insufficient documentation

## 2014-11-10 DIAGNOSIS — S8991XA Unspecified injury of right lower leg, initial encounter: Secondary | ICD-10-CM | POA: Insufficient documentation

## 2014-11-10 DIAGNOSIS — S6991XA Unspecified injury of right wrist, hand and finger(s), initial encounter: Secondary | ICD-10-CM | POA: Diagnosis not present

## 2014-11-10 DIAGNOSIS — Z794 Long term (current) use of insulin: Secondary | ICD-10-CM | POA: Diagnosis not present

## 2014-11-10 DIAGNOSIS — Z859 Personal history of malignant neoplasm, unspecified: Secondary | ICD-10-CM | POA: Insufficient documentation

## 2014-11-10 DIAGNOSIS — I5030 Unspecified diastolic (congestive) heart failure: Secondary | ICD-10-CM | POA: Diagnosis not present

## 2014-11-10 DIAGNOSIS — Z87891 Personal history of nicotine dependence: Secondary | ICD-10-CM | POA: Diagnosis not present

## 2014-11-10 DIAGNOSIS — Z7982 Long term (current) use of aspirin: Secondary | ICD-10-CM | POA: Insufficient documentation

## 2014-11-10 DIAGNOSIS — T1490XA Injury, unspecified, initial encounter: Secondary | ICD-10-CM

## 2014-11-10 DIAGNOSIS — E119 Type 2 diabetes mellitus without complications: Secondary | ICD-10-CM | POA: Diagnosis not present

## 2014-11-10 DIAGNOSIS — Y9389 Activity, other specified: Secondary | ICD-10-CM | POA: Insufficient documentation

## 2014-11-10 DIAGNOSIS — R918 Other nonspecific abnormal finding of lung field: Secondary | ICD-10-CM

## 2014-11-10 DIAGNOSIS — Y998 Other external cause status: Secondary | ICD-10-CM | POA: Diagnosis not present

## 2014-11-10 DIAGNOSIS — S3992XA Unspecified injury of lower back, initial encounter: Secondary | ICD-10-CM | POA: Diagnosis present

## 2014-11-10 DIAGNOSIS — F0390 Unspecified dementia without behavioral disturbance: Secondary | ICD-10-CM | POA: Insufficient documentation

## 2014-11-10 LAB — I-STAT CHEM 8, ED
BUN: 27 mg/dL — ABNORMAL HIGH (ref 6–20)
Calcium, Ion: 1.08 mmol/L — ABNORMAL LOW (ref 1.13–1.30)
Chloride: 95 mmol/L — ABNORMAL LOW (ref 101–111)
Creatinine, Ser: 1.3 mg/dL — ABNORMAL HIGH (ref 0.61–1.24)
Glucose, Bld: 104 mg/dL — ABNORMAL HIGH (ref 65–99)
HCT: 37 % — ABNORMAL LOW (ref 39.0–52.0)
Hemoglobin: 12.6 g/dL — ABNORMAL LOW (ref 13.0–17.0)
Potassium: 4.8 mmol/L (ref 3.5–5.1)
Sodium: 137 mmol/L (ref 135–145)
TCO2: 32 mmol/L (ref 0–100)

## 2014-11-10 LAB — COMPREHENSIVE METABOLIC PANEL
ALT: 8 U/L — ABNORMAL LOW (ref 17–63)
AST: 32 U/L (ref 15–41)
Albumin: 3.4 g/dL — ABNORMAL LOW (ref 3.5–5.0)
Alkaline Phosphatase: 81 U/L (ref 38–126)
Anion gap: 8 (ref 5–15)
BUN: 20 mg/dL (ref 6–20)
CO2: 33 mmol/L — ABNORMAL HIGH (ref 22–32)
Calcium: 9.1 mg/dL (ref 8.9–10.3)
Chloride: 96 mmol/L — ABNORMAL LOW (ref 101–111)
Creatinine, Ser: 1.3 mg/dL — ABNORMAL HIGH (ref 0.61–1.24)
GFR calc Af Amer: 56 mL/min — ABNORMAL LOW (ref >60)
GFR calc non Af Amer: 48 mL/min — ABNORMAL LOW (ref >60)
Glucose, Bld: 105 mg/dL — ABNORMAL HIGH (ref 65–99)
Potassium: 4.8 mmol/L (ref 3.5–5.1)
Sodium: 137 mmol/L (ref 135–145)
Total Bilirubin: 0.6 mg/dL (ref 0.3–1.2)
Total Protein: 6.8 g/dL (ref 6.5–8.1)

## 2014-11-10 LAB — CBC
HCT: 35.8 % — ABNORMAL LOW (ref 39.0–52.0)
Hemoglobin: 10.6 g/dL — ABNORMAL LOW (ref 13.0–17.0)
MCH: 25.3 pg — ABNORMAL LOW (ref 26.0–34.0)
MCHC: 29.6 g/dL — ABNORMAL LOW (ref 30.0–36.0)
MCV: 85.4 fL (ref 78.0–100.0)
Platelets: 178 10*3/uL (ref 150–400)
RBC: 4.19 MIL/uL — ABNORMAL LOW (ref 4.22–5.81)
RDW: 15.1 % (ref 11.5–15.5)
WBC: 8.3 10*3/uL (ref 4.0–10.5)

## 2014-11-10 LAB — PROTIME-INR
INR: 1.12 (ref 0.00–1.49)
PROTHROMBIN TIME: 14.6 s (ref 11.6–15.2)

## 2014-11-10 LAB — I-STAT CG4 LACTIC ACID, ED: LACTIC ACID, VENOUS: 0.82 mmol/L (ref 0.5–2.0)

## 2014-11-10 NOTE — ED Notes (Signed)
Dr. Tsui at bedside.  

## 2014-11-10 NOTE — ED Notes (Addendum)
Brett Harris with [employer] brought TLSO brace. Patient refused and family support refusal stating it will only agitate him. They do not wish to do any treatments that will increase the patient's discomfort at this time. Dr. Robina Ade notified.
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
## 2014-11-10 NOTE — Discharge Instructions (Signed)
Back, Compression Fracture °A compression fracture happens when a force is put upon the length of your spine. Slipping and falling on your bottom are examples of such a force. When this happens, sometimes the force is great enough to compress the building blocks (vertebral bodies) of your spine. Although this causes a lot of pain, this can usually be treated at home, unless your caregiver feels hospitalization is needed for pain control. °Your backbone (spinal column) is made up of 24 main vertebral bodies in addition to the sacrum and coccyx (see illustration). These are held together by tough fibrous tissues (ligaments) and by support of your muscles. Nerve roots pass through the openings between the vertebrae. A sudden wrenching move, injury, or a fall may cause a compression fracture of one of the vertebral bodies. This may result in back pain or spread of pain into the belly (abdomen), the buttocks, and down the leg into the foot. Pain may also be created by muscle spasm alone. °Large studies have been undertaken to determine the best possible course of action to help your back following injury and also to prevent future problems. The recommendations are as follows. °FOLLOWING A COMPRESSION FRACTURE: °Do the following only if advised by your caregiver.  °· If a back brace has been suggested or provided, wear it as directed. °· Do not stop wearing the back brace unless instructed by your caregiver. °· When allowed to return to regular activities, avoid a sedentary lifestyle. Actively exercise. Sporadic weekend binges of tennis, racquetball, or waterskiing may actually aggravate or create problems, especially if you are not in condition for that activity. °· Avoid sports requiring sudden body movements until you are in condition for them. Swimming and walking are safer activities. °· Maintain good posture. °· Avoid obesity. °· If not already done, you should have a DEXA scan. Based on the results, be treated for  osteoporosis. °FOLLOWING ACUTE (SUDDEN) INJURY: °· Only take over-the-counter or prescription medicines for pain, discomfort, or fever as directed by your caregiver. °· Use bed rest for only the most extreme acute episode. Prolonged bed rest may aggravate your condition. Ice used for acute conditions is effective. Use a large plastic bag filled with ice. Wrap it in a towel. This also provides excellent pain relief. This may be continuous. Or use it for 30 minutes every 2 hours during acute phase, then as needed. Heat for 30 minutes prior to activities is helpful. °· As soon as the acute phase (the time when your back is too painful for you to do normal activities) is over, it is important to resume normal activities and work hardening programs. Back injuries can cause potentially marked changes in lifestyle. So it is important to attack these problems aggressively. °· See your caregiver for continued problems. He or she can help or refer you for appropriate exercises, physical therapy, and work hardening if needed. °· If you are given narcotic medications for your condition, for the next 24 hours do not: °¨ Drive. °¨ Operate machinery or power tools. °¨ Sign legal documents. °· Do not drink alcohol, or take sleeping pills or other medications that may interfere with treatment. °If your caregiver has given you a follow-up appointment, it is very important to keep that appointment. Not keeping the appointment could result in a chronic or permanent injury, pain, and disability. If there is any problem keeping the appointment, you must call back to this facility for assistance.  °SEEK IMMEDIATE MEDICAL CARE IF: °· You develop numbness,   tingling, weakness, or problems with the use of your arms or legs. °· You develop severe back pain not relieved with medications. °· You have changes in bowel or bladder control. °· You have increasing pain in any areas of the body. °Document Released: 02/06/2005 Document Revised:  06/23/2013 Document Reviewed: 09/11/2007 °[employer]® Patient Information ©2015 [employer]. This information is not intended to replace advice given to you by your health care provider. Make sure you discuss any questions you have with your health care provider. ° °

## 2014-11-10 NOTE — ED Notes (Signed)
Pt in CT.
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
## 2014-11-10 NOTE — ED Notes (Signed)
Spoke with ortho tech. TLSO brace requested from vendor. Tech will bring and apply once received.
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
## 2014-11-10 NOTE — ED Notes (Signed)
MD at bedside. 

## 2014-11-10 NOTE — Progress Notes (Signed)
[facility] Progress Note Patient Details:  Brett Harris Apr 26, 1927 [id number] Called in brace order to [employer]. Patient ID: Brett Harris, male   DOB: 05-12-1927, 81 y.o.   MRN: [id number]   Brett Harris 11/10/2014, 8:59 PM

## 2014-11-11 LAB — SAMPLE TO BLOOD BANK

## 2014-11-11 MED ORDER — IOHEXOL 300 MG/ML  SOLN
100.0000 mL | Freq: Once | INTRAMUSCULAR | Status: AC | PRN
  Administered 2014-11-10: 100 mL via INTRAVENOUS

## 2015-01-21 DEATH — deceased

## 2016-09-22 IMAGING — CR DG HIP COMPLETE 2+V*R*
2 series · 2 of 2 positions shown · non-contrast
Comparison: None.

CLINICAL DATA: Right hip pain, fell several days ago

EXAM:
RIGHT HIP - COMPLETE 2+ VIEW

[t hip ap right]
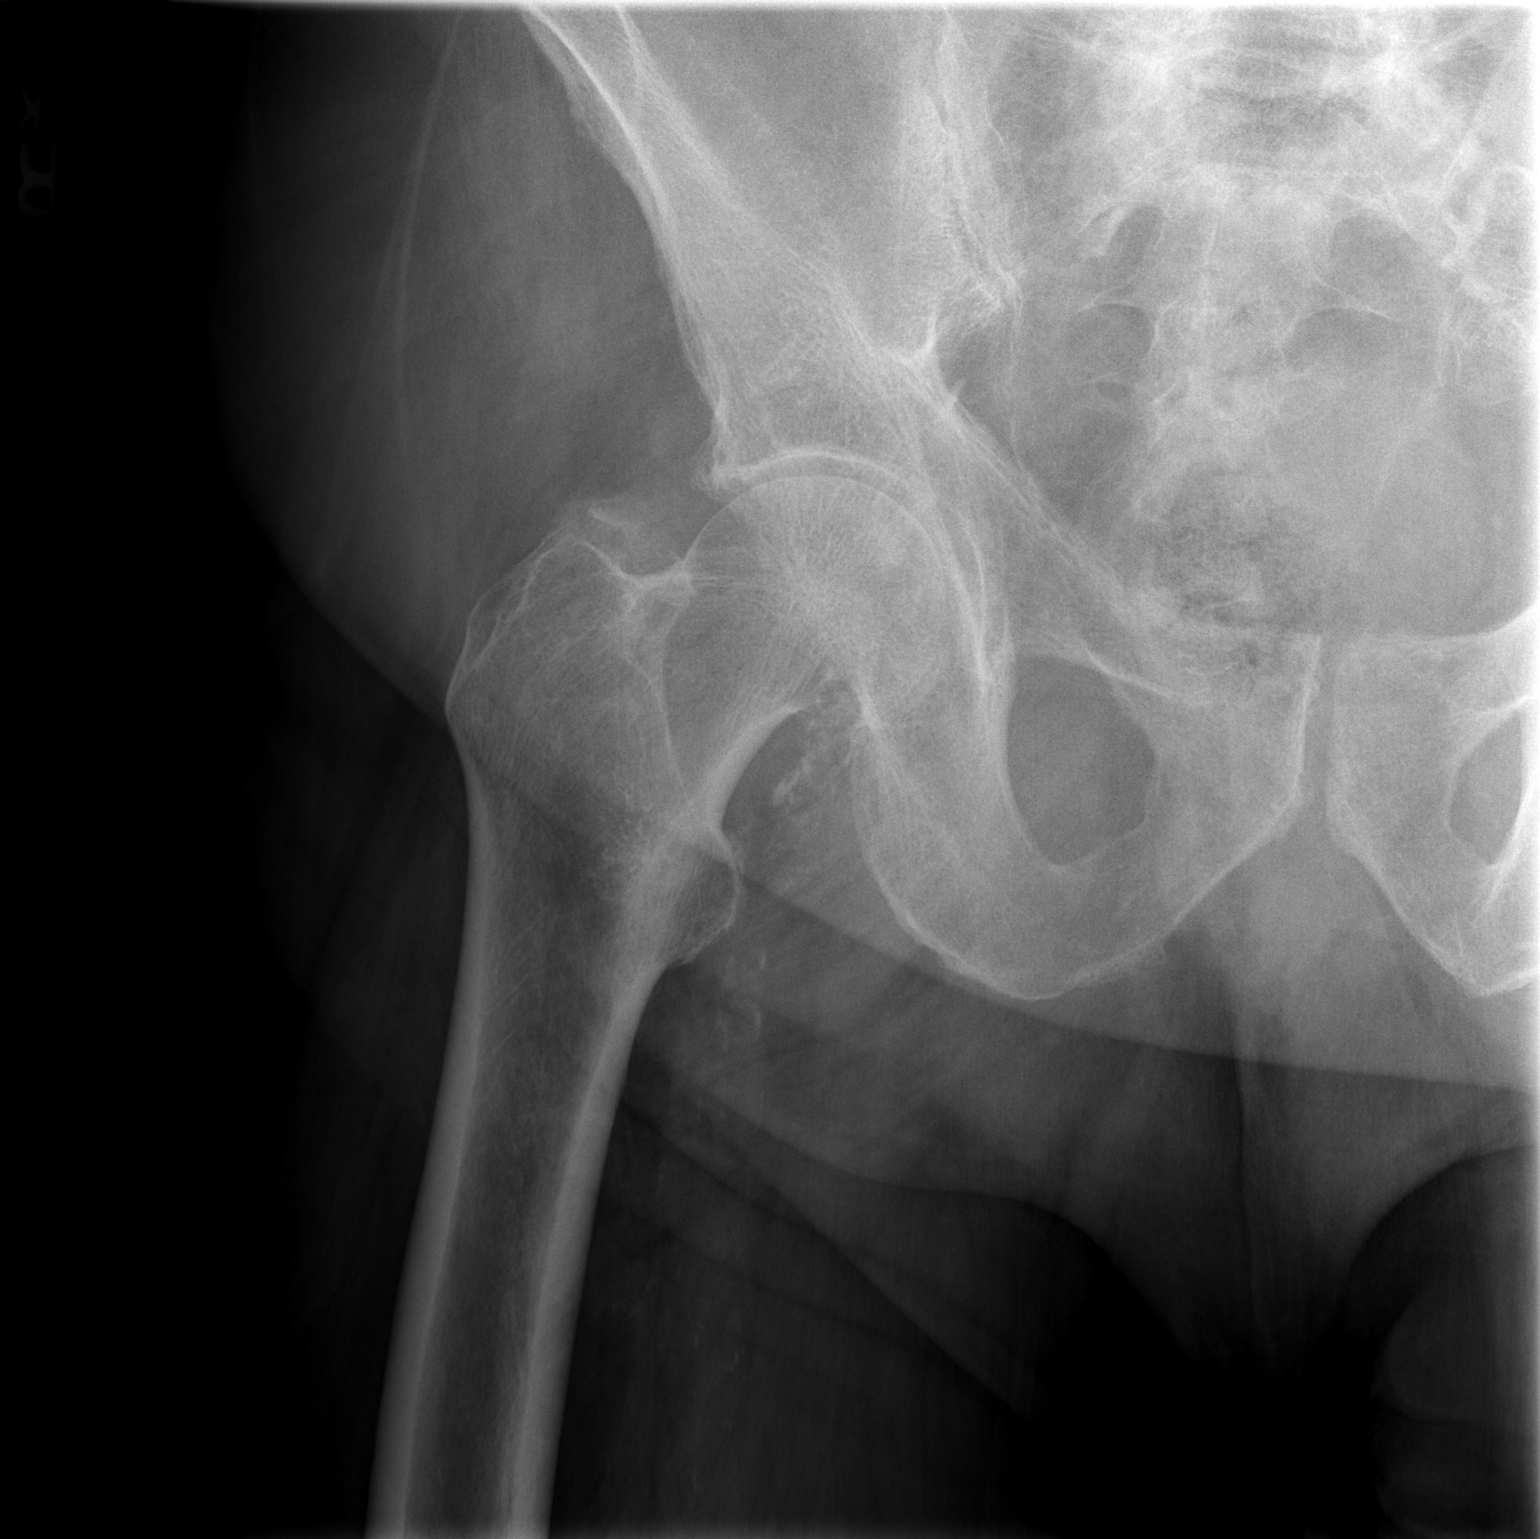

[t hip frog leg right]
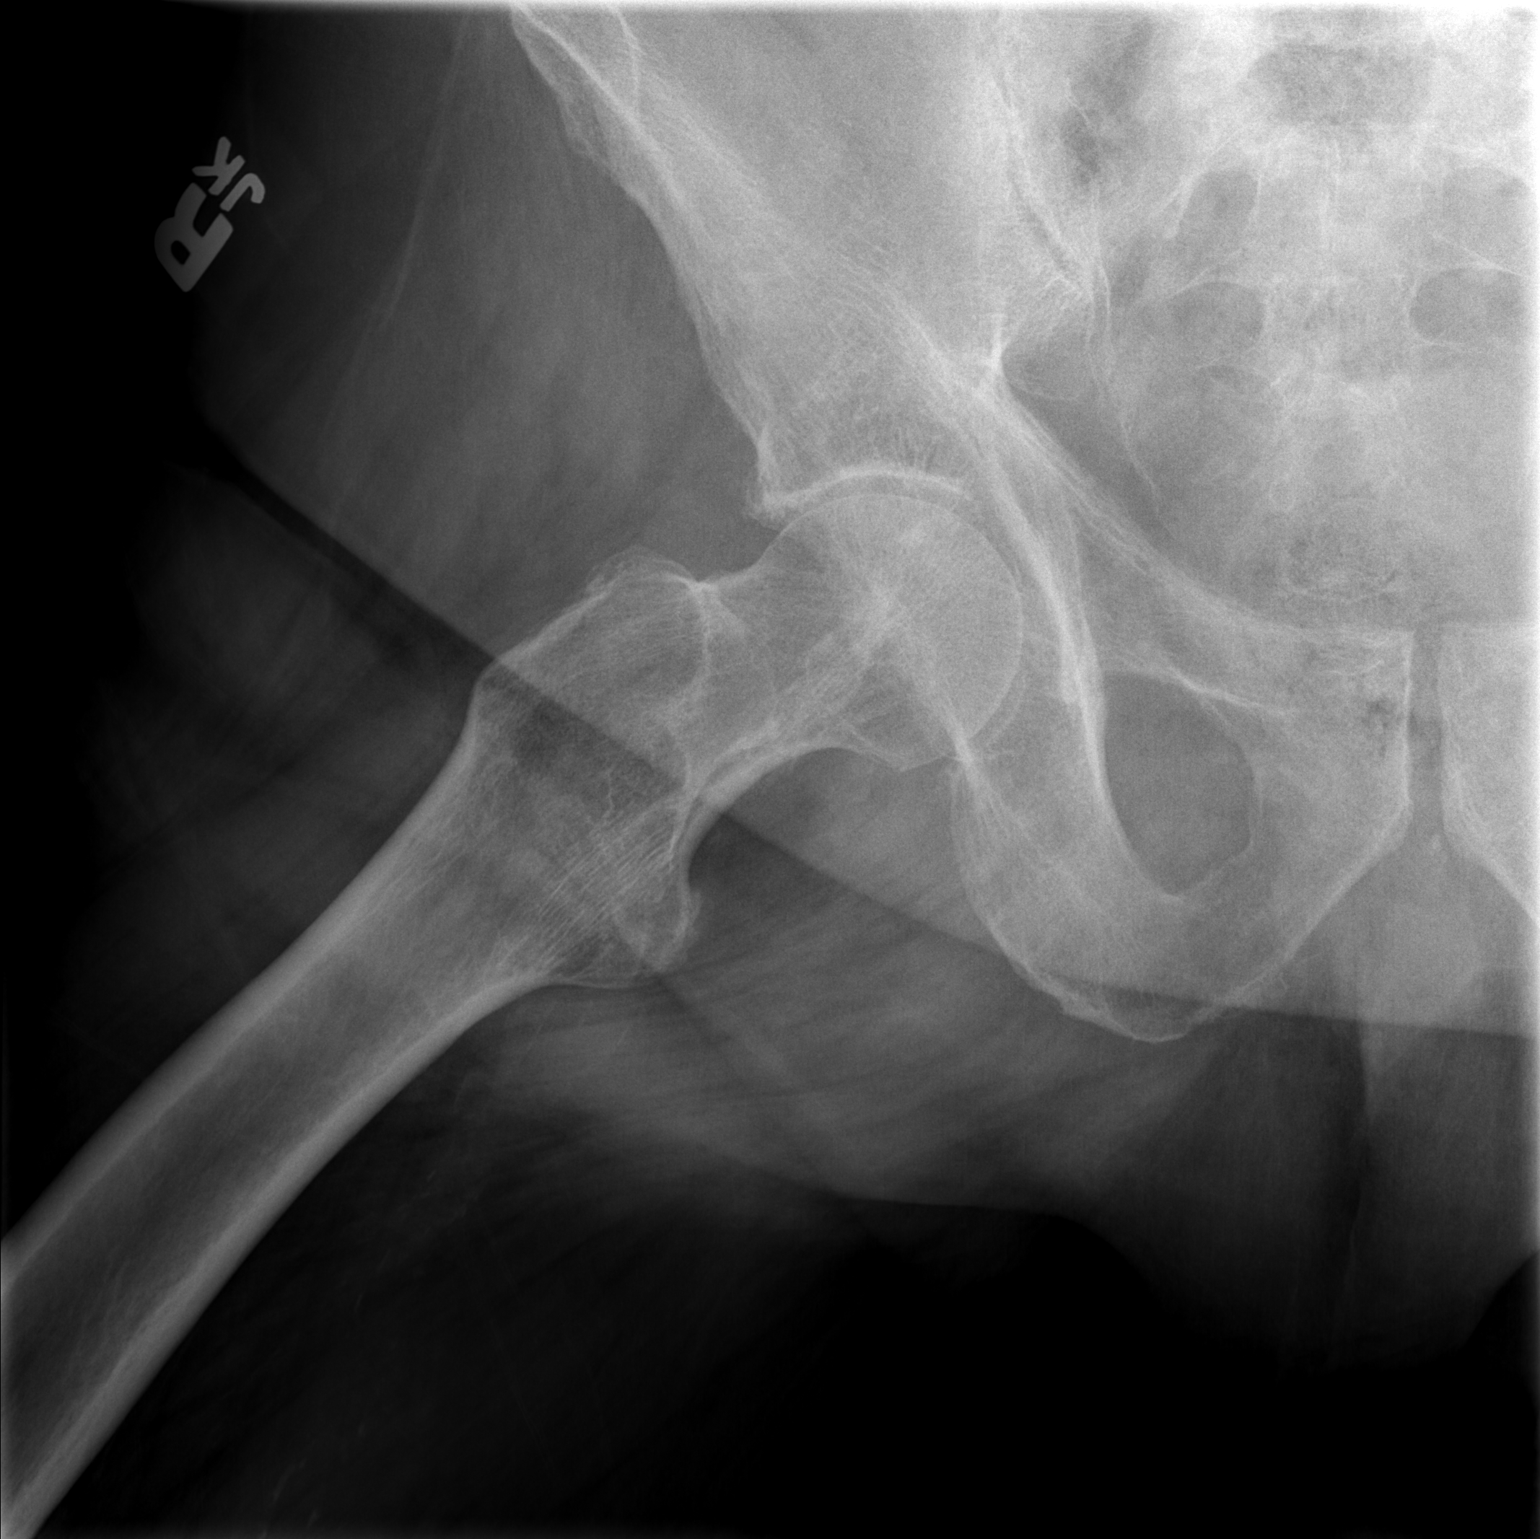

[2 of 2 positions shown; findings below may reference images not displayed]

FINDINGS: Moderate degenerative joint disease of the right hip is noted with
some loss of joint space and spurring. However no acute fracture is
seen. The right pelvic ramus appears intact. The right SI joint is
corticated.
IMPRESSION: Moderate degenerative joint disease the right hip. No acute
abnormality.

## 2016-09-22 IMAGING — CR DG RIBS W/ CHEST 3+V*R*
4 series · 4 of 4 positions shown · non-contrast
Comparison: Chest x-ray of 01/21/2014 and 04/29/2013

CLINICAL DATA: Fell several days ago, right upper anterior and
posterior chest and rib pain, shortness of breath

EXAM:
RIGHT RIBS AND CHEST - 3+ VIEW

[w chest pa]
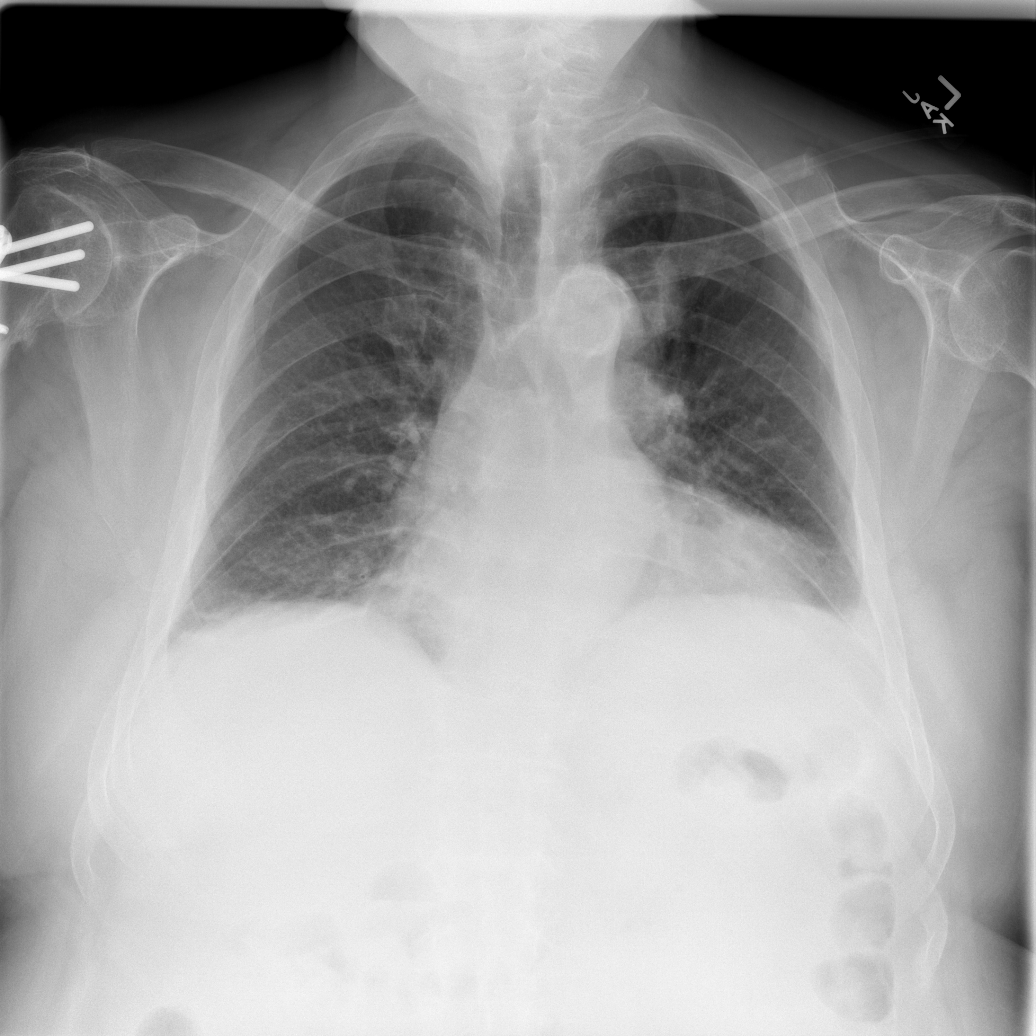

[w ribs ap/pa upper right *]
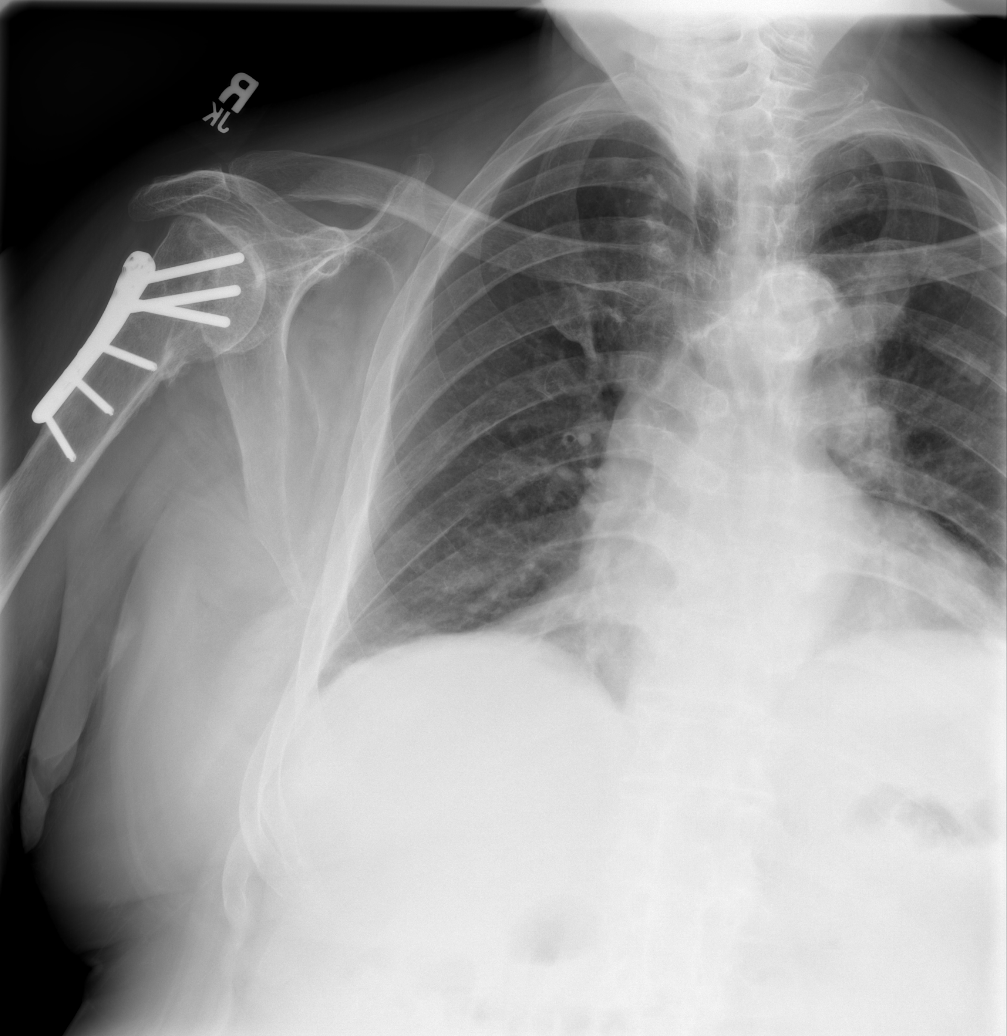

[w ribs oblique right * (1 of 2)]
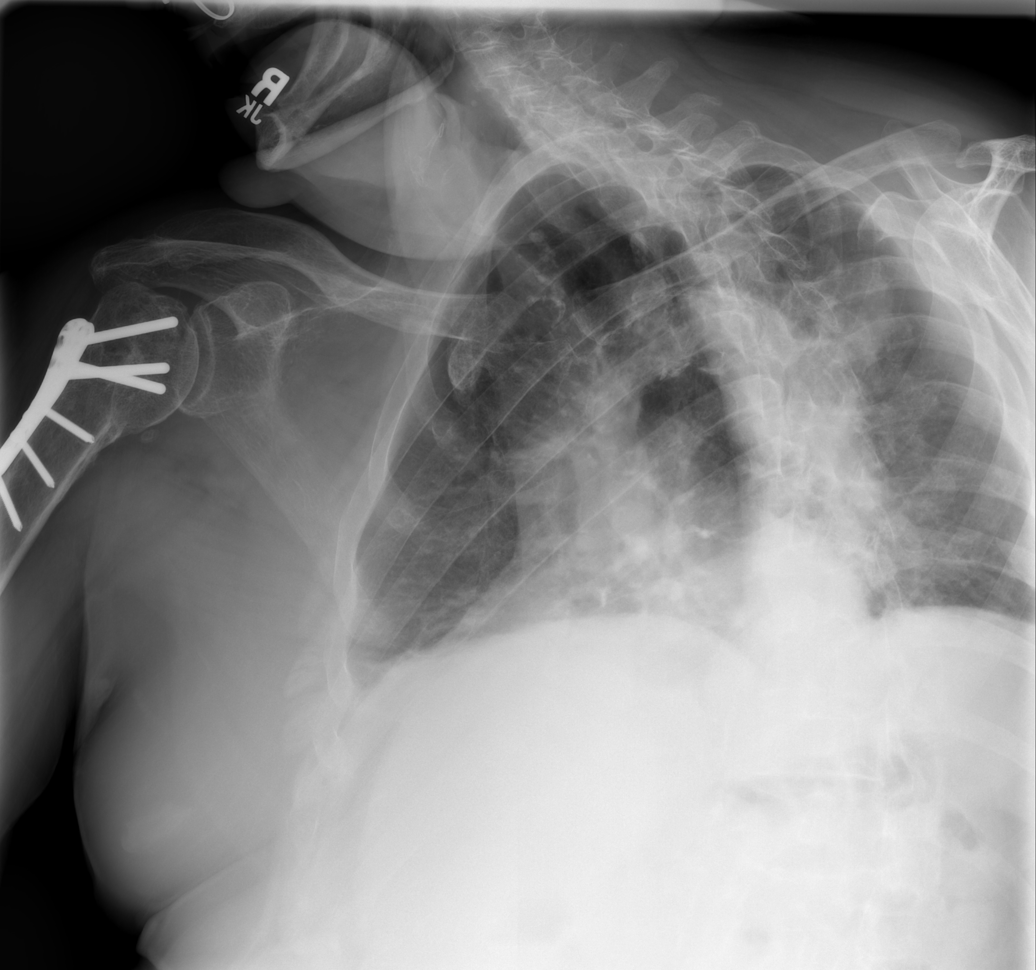

[w ribs oblique right * (2 of 2)]
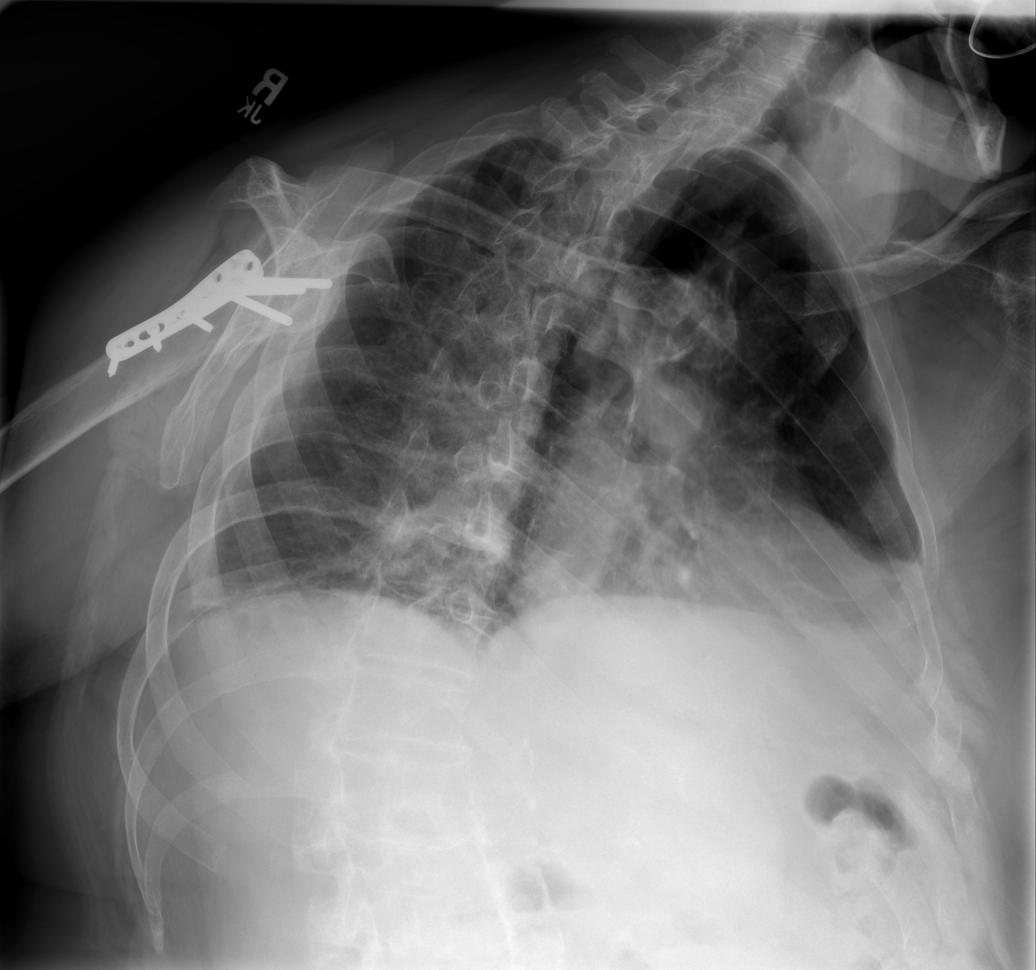

[4 of 4 positions shown; findings below may reference images not displayed]

FINDINGS: There is mild bibasilar atelectasis present. Small effusions cannot
be excluded. There is cardiomegaly present. No definite congestive
heart failure is noted. There is density along the anterior left
first costochondral junction which could be degenerative in origin
but attention to this area on followup chest x-ray is recommended.

Right rib detail films show no definite acute right rib fracture.
IMPRESSION: 1. Bibasilar linear atelectasis.
2. No definite right rib fracture is seen.
3. Opacity overlying the anterior left first costochondral junction
may be bony in origin but recommend attention to this area on
followup chest x-ray.

## 2016-09-22 IMAGING — CR DG LUMBAR SPINE 2-3V
2 series · 2 of 2 positions shown · non-contrast
Comparison: None.

CLINICAL DATA: Fell several days ago, low back pain

EXAM:
LUMBAR SPINE - 2-3 VIEW

[t l-spine a.p.]
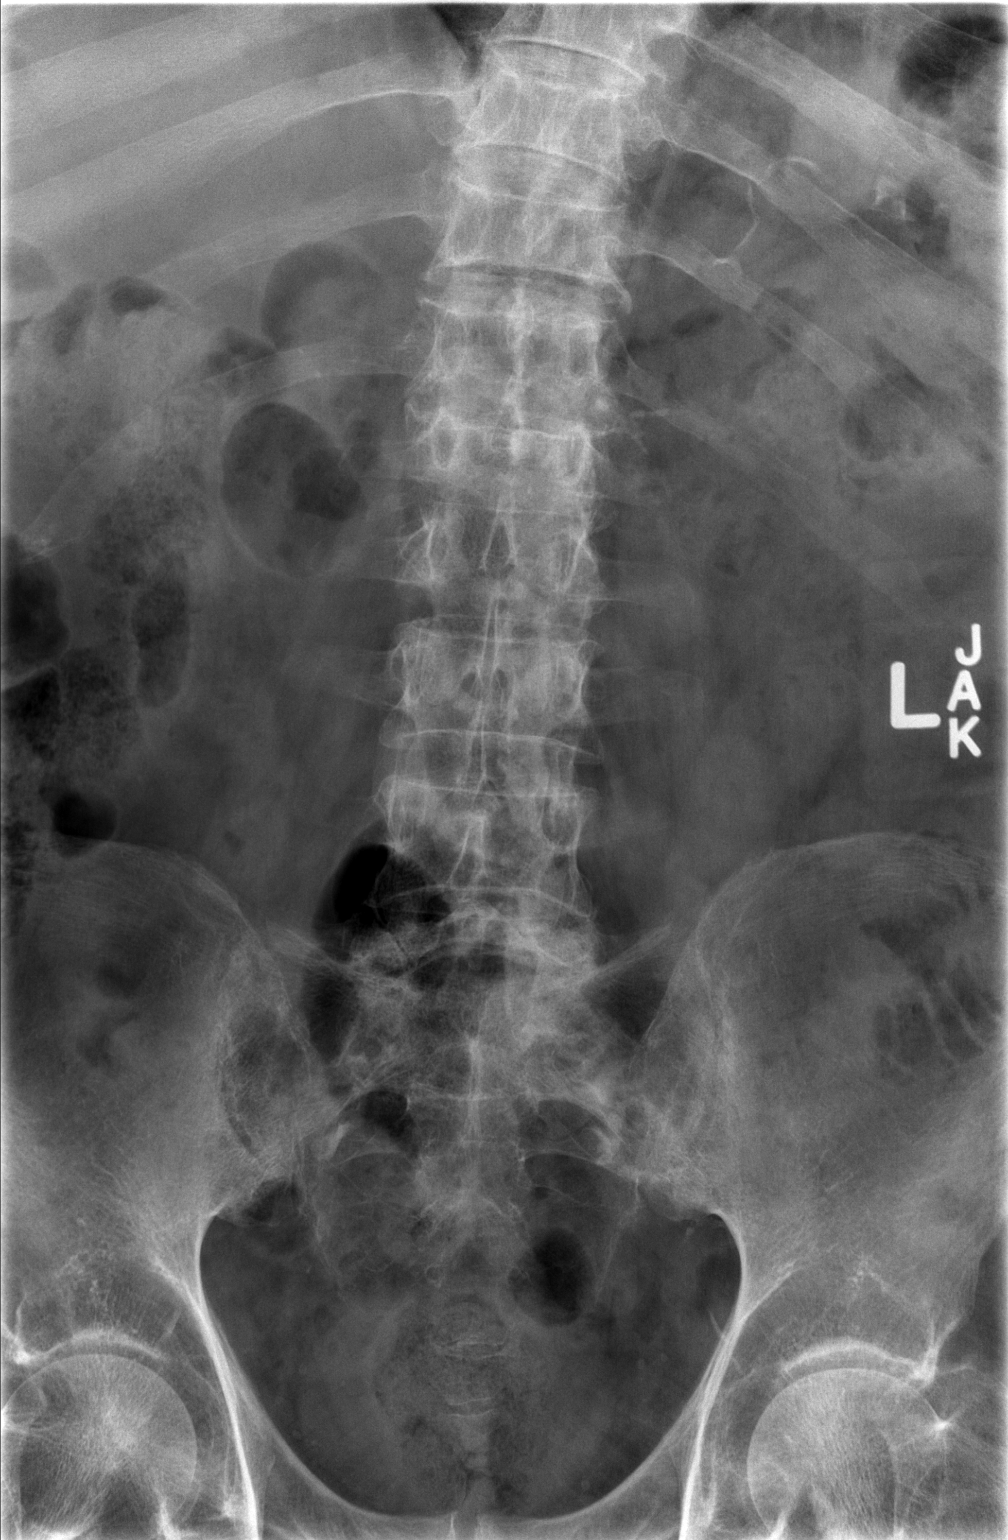

[t l-spine lat]
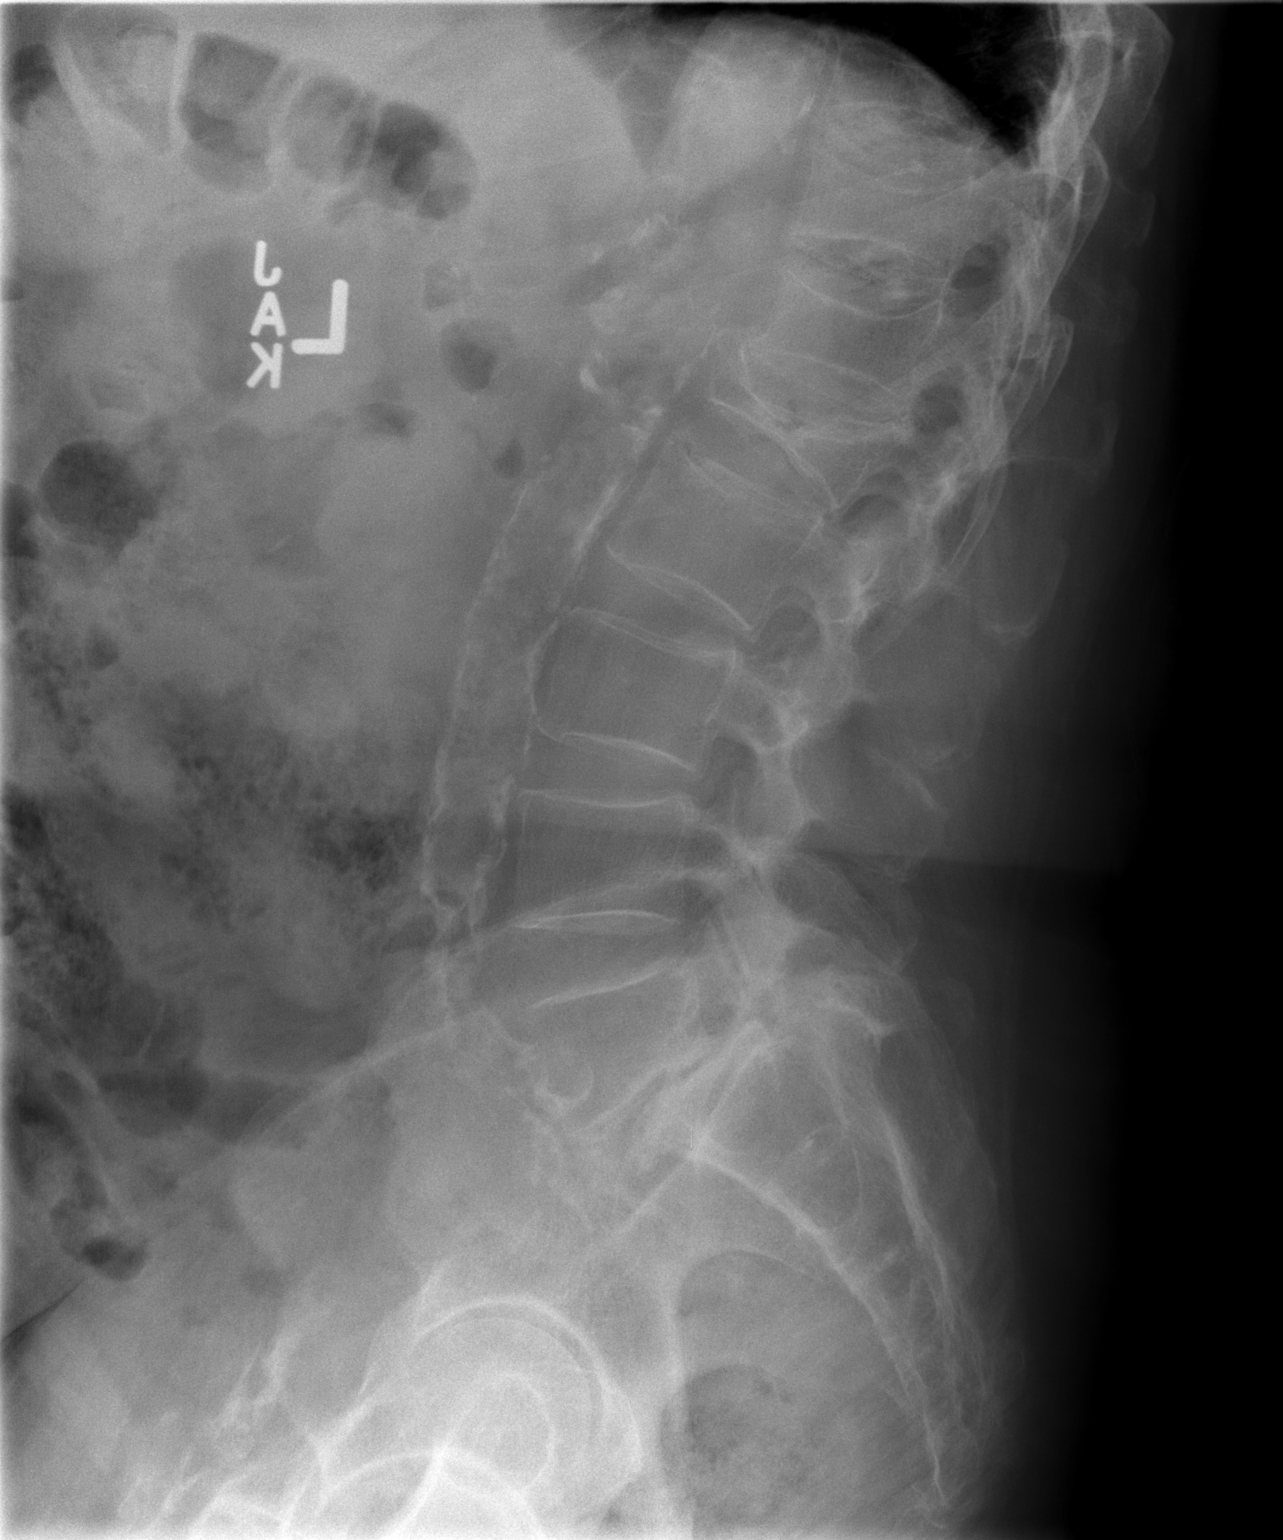

[2 of 2 positions shown; findings below may reference images not displayed]

FINDINGS: There is compression deformity of L1 vertebral body of approximately
80% with slight retropulsion at that level. CT of this region may be
helpful to assess further. The bones are diffusely osteopenic. There
is a 12 mm anterolisthesis of L5 on S1 with degenerative disc
disease at L5-S1. There may be pars defects at L5 which are not well
seen on the lateral view obtained. The SI joints are corticated.
IMPRESSION: 1. Approximately 80% compression deformity of L1 with slight
retropulsion. Consider further assessment with CT or MRI.
2. 12 mm anterolisthesis of L5 on S1 with degenerative disc disease
at L5-S1 and possible pars defects at that level.
